# Patient Record
Sex: Female | Born: 1975 | Race: Black or African American | Hispanic: No | Smoking: Never smoker
Health system: Southern US, Community
[De-identification: ages and names within clinical notes are randomized; demographics above are authoritative.]

## PROBLEM LIST (undated history)

## (undated) DIAGNOSIS — I10 Essential (primary) hypertension: Secondary | ICD-10-CM

## (undated) HISTORY — PX: WISDOM TOOTH EXTRACTION: SHX21

---

## 1999-03-18 ENCOUNTER — Emergency Department (HOSPITAL_COMMUNITY): Admission: EM | Admit: 1999-03-18 | Discharge: 1999-03-18 | Payer: Self-pay | Admitting: *Deleted

## 1999-11-01 ENCOUNTER — Emergency Department (HOSPITAL_COMMUNITY): Admission: EM | Admit: 1999-11-01 | Discharge: 1999-11-01 | Payer: Self-pay | Admitting: *Deleted

## 1999-11-02 ENCOUNTER — Encounter: Payer: Self-pay | Admitting: Emergency Medicine

## 2002-11-07 ENCOUNTER — Inpatient Hospital Stay (HOSPITAL_COMMUNITY): Admission: AD | Admit: 2002-11-07 | Discharge: 2002-11-07 | Payer: Self-pay | Admitting: Family Medicine

## 2002-11-10 ENCOUNTER — Encounter: Payer: Self-pay | Admitting: Obstetrics and Gynecology

## 2002-11-10 ENCOUNTER — Inpatient Hospital Stay (HOSPITAL_COMMUNITY): Admission: AD | Admit: 2002-11-10 | Discharge: 2002-11-10 | Payer: Self-pay | Admitting: Obstetrics and Gynecology

## 2002-11-13 ENCOUNTER — Inpatient Hospital Stay (HOSPITAL_COMMUNITY): Admission: AD | Admit: 2002-11-13 | Discharge: 2002-11-13 | Payer: Self-pay | Admitting: *Deleted

## 2002-11-13 ENCOUNTER — Encounter: Payer: Self-pay | Admitting: Obstetrics and Gynecology

## 2002-11-16 ENCOUNTER — Inpatient Hospital Stay (HOSPITAL_COMMUNITY): Admission: AD | Admit: 2002-11-16 | Discharge: 2002-11-16 | Payer: Self-pay | Admitting: Obstetrics and Gynecology

## 2002-11-19 ENCOUNTER — Inpatient Hospital Stay (HOSPITAL_COMMUNITY): Admission: AD | Admit: 2002-11-19 | Discharge: 2002-11-19 | Payer: Self-pay | Admitting: Family Medicine

## 2002-11-26 ENCOUNTER — Inpatient Hospital Stay (HOSPITAL_COMMUNITY): Admission: AD | Admit: 2002-11-26 | Discharge: 2002-11-26 | Payer: Self-pay | Admitting: Family Medicine

## 2002-12-03 ENCOUNTER — Inpatient Hospital Stay (HOSPITAL_COMMUNITY): Admission: AD | Admit: 2002-12-03 | Discharge: 2002-12-03 | Payer: Self-pay | Admitting: *Deleted

## 2004-04-08 ENCOUNTER — Inpatient Hospital Stay (HOSPITAL_COMMUNITY): Admission: AD | Admit: 2004-04-08 | Discharge: 2004-04-09 | Payer: Self-pay | Admitting: Family Medicine

## 2004-05-09 ENCOUNTER — Other Ambulatory Visit: Admission: RE | Admit: 2004-05-09 | Discharge: 2004-05-09 | Payer: Self-pay | Admitting: Obstetrics and Gynecology

## 2004-09-11 ENCOUNTER — Ambulatory Visit (HOSPITAL_COMMUNITY): Admission: RE | Admit: 2004-09-11 | Discharge: 2004-09-11 | Payer: Self-pay | Admitting: Obstetrics and Gynecology

## 2004-10-15 ENCOUNTER — Inpatient Hospital Stay (HOSPITAL_COMMUNITY): Admission: AD | Admit: 2004-10-15 | Discharge: 2004-10-17 | Payer: Self-pay | Admitting: Obstetrics and Gynecology

## 2005-05-21 ENCOUNTER — Other Ambulatory Visit: Admission: RE | Admit: 2005-05-21 | Discharge: 2005-05-21 | Payer: Self-pay | Admitting: Obstetrics and Gynecology

## 2006-05-26 ENCOUNTER — Other Ambulatory Visit: Admission: RE | Admit: 2006-05-26 | Discharge: 2006-05-26 | Payer: Self-pay | Admitting: Obstetrics and Gynecology

## 2007-07-12 ENCOUNTER — Emergency Department (HOSPITAL_COMMUNITY): Admission: EM | Admit: 2007-07-12 | Discharge: 2007-07-12 | Payer: Self-pay | Admitting: Emergency Medicine

## 2010-11-07 ENCOUNTER — Inpatient Hospital Stay (INDEPENDENT_AMBULATORY_CARE_PROVIDER_SITE_OTHER)
Admission: RE | Admit: 2010-11-07 | Discharge: 2010-11-07 | Disposition: A | Discharge status: Home / Self Care | Payer: Medicaid Other | Source: Ambulatory Visit | Attending: Family Medicine | Admitting: Family Medicine

## 2010-11-07 DIAGNOSIS — H9209 Otalgia, unspecified ear: Secondary | ICD-10-CM

## 2010-11-07 DIAGNOSIS — K089 Disorder of teeth and supporting structures, unspecified: Secondary | ICD-10-CM

## 2011-02-01 NOTE — H&P (Signed)
Kara Cain, Kara Cain             ACCOUNT NO.:  [id number]   MEDICAL RECORD NO.:  [id number]          PATIENT TYPE:  INP   LOCATION:  9176                          FACILITY:  [facility]   PHYSICIAN:  Hal Morales, M.D.DATE OF BIRTH:  1976-07-13   DATE OF ADMISSION:  10/15/2004  DATE OF DISCHARGE:                                HISTORY & PHYSICAL   This is a 35 year old gravida 4, para 0-0-3-0 at 38-5/7 weeks who presents  from the office in labor.  She denies leaking or bleeding and reports  positive fetal movement.  Her pregnancy has been followed by the nurse  midwife service and remarkable for:  1.  Late to care.  2.  Rubella nonimmune.  3.  History of sexually-transmitted diseases.  4.  Elective abortion x2 and ectopic x1.  5.  Group B strep negative.   OBSTETRICAL HISTORY:  Remarkable for two elective abortions in 2003.  She  had an ectopic pregnancy in 2004 treated with methotrexate.   MEDICAL HISTORY:  Remarkable for Gonorrhea and Trichomonas in 2000 and 1999,  respectively which were treated.  Childhood Varicella.  Urinary tract  infection in 1999.   FAMILY HISTORY:  Remarkable for a mother and aunts with hypertension,  grandmother with diabetes, cousin with unknown type of cancer.  Sisters who  smoke.   SURGICAL HISTORY:  Remarkable for elective abortion x2 in 2003.   GENETIC HISTORY:  Remarkable for a sister with a hole in her heart as a baby  which closed on its own.   SOCIAL HISTORY:  The patient is single.  The father of the baby, Fara Olden  is involved and supportive.  She is of the [place] faith.  She denies any  alcohol, tobacco or drug use.   HISTORY OF CURRENT PREGNANCY:  The patient entered care at [redacted] weeks  gestation.  She had an ultrasound at 18 weeks which was normal.  She had  requested to have her tubes tied and did sign tubal papers at 29 weeks.  She  had another ultrasound for low weight gain at 33 weeks which showed 50-75th  percentile, a  grade I placenta and normal AFI and her group B strep, and GC  and Chlamydia were both all negative.   PRENATAL LABORATORIES:  Hemoglobin 12.1, platelets 266,000, blood type B+,  antibody screen negative, Sickle Cell negative.  RPR nonreactive.  Rubella  non immune.  Hepatitis negative.  HIV negative.  Pap test negative.  Gonorrhea negative.  Chlamydia negative.  Cystic fibrosis negative.  Quad  screen within normal limits.  Group B strep negative.   OBJECTIVE DATA:  VITAL SIGNS:  Vital signs stable.  Afebrile.  HEENT:  Within normal limits.  NECK:  Thyroid normal, not enlarged.  CHEST: Clear to auscultation.  HEART:  A regular rate and rhythm.  ABDOMEN:  Gravid at 38 cm.  Vertex de Leopold's.  The EFM shows a reactive  fetal heart rate with contractions every three minutes.  Cervix was  initially 4 cm dilated and is now 5 cm dilated, completely effaced and 0  station with a vertex  presentation.  EXTREMITIES:  Within normal limits.   ASSESSMENT:  1.  Intra-uterine pregnancy at 38-5/7 weeks.  2.  Active labor.   PLAN:  1.  Admit to birthing suites.  2.  Routine certified nurse midwife orders.  3.  Stadol for analgesia per request.      MLW/MEDQ  D:  10/15/2004  T:  10/15/2004  Job:  [id number]

## 2013-07-18 DIAGNOSIS — Z0000 Encounter for general adult medical examination without abnormal findings: Secondary | ICD-10-CM | POA: Insufficient documentation

## 2016-11-30 ENCOUNTER — Emergency Department (HOSPITAL_COMMUNITY): Payer: 59

## 2016-11-30 ENCOUNTER — Emergency Department (HOSPITAL_COMMUNITY)
Admission: EM | Admit: 2016-11-30 | Discharge: 2016-11-30 | Disposition: A | Discharge status: Home / Self Care | Payer: 59 | Attending: Emergency Medicine | Admitting: Emergency Medicine

## 2016-11-30 ENCOUNTER — Encounter (HOSPITAL_COMMUNITY): Payer: Self-pay | Admitting: Emergency Medicine

## 2016-11-30 DIAGNOSIS — S299XXA Unspecified injury of thorax, initial encounter: Secondary | ICD-10-CM | POA: Diagnosis not present

## 2016-11-30 DIAGNOSIS — T148XXA Other injury of unspecified body region, initial encounter: Secondary | ICD-10-CM | POA: Diagnosis not present

## 2016-11-30 DIAGNOSIS — Y939 Activity, unspecified: Secondary | ICD-10-CM | POA: Insufficient documentation

## 2016-11-30 DIAGNOSIS — M542 Cervicalgia: Secondary | ICD-10-CM | POA: Diagnosis not present

## 2016-11-30 DIAGNOSIS — R51 Headache: Secondary | ICD-10-CM | POA: Diagnosis not present

## 2016-11-30 DIAGNOSIS — M549 Dorsalgia, unspecified: Secondary | ICD-10-CM | POA: Diagnosis not present

## 2016-11-30 DIAGNOSIS — M545 Low back pain: Secondary | ICD-10-CM | POA: Diagnosis not present

## 2016-11-30 DIAGNOSIS — M79601 Pain in right arm: Secondary | ICD-10-CM | POA: Diagnosis not present

## 2016-11-30 DIAGNOSIS — M25561 Pain in right knee: Secondary | ICD-10-CM | POA: Diagnosis not present

## 2016-11-30 DIAGNOSIS — Y999 Unspecified external cause status: Secondary | ICD-10-CM | POA: Insufficient documentation

## 2016-11-30 DIAGNOSIS — S5011XA Contusion of right forearm, initial encounter: Secondary | ICD-10-CM | POA: Diagnosis not present

## 2016-11-30 DIAGNOSIS — M79604 Pain in right leg: Secondary | ICD-10-CM | POA: Diagnosis not present

## 2016-11-30 DIAGNOSIS — S6991XA Unspecified injury of right wrist, hand and finger(s), initial encounter: Secondary | ICD-10-CM | POA: Diagnosis not present

## 2016-11-30 DIAGNOSIS — S59911A Unspecified injury of right forearm, initial encounter: Secondary | ICD-10-CM | POA: Diagnosis not present

## 2016-11-30 DIAGNOSIS — M25531 Pain in right wrist: Secondary | ICD-10-CM | POA: Diagnosis not present

## 2016-11-30 DIAGNOSIS — Y92410 Unspecified street and highway as the place of occurrence of the external cause: Secondary | ICD-10-CM | POA: Diagnosis not present

## 2016-11-30 DIAGNOSIS — S199XXA Unspecified injury of neck, initial encounter: Secondary | ICD-10-CM | POA: Diagnosis not present

## 2016-11-30 DIAGNOSIS — S63501A Unspecified sprain of right wrist, initial encounter: Secondary | ICD-10-CM | POA: Diagnosis not present

## 2016-11-30 DIAGNOSIS — M79631 Pain in right forearm: Secondary | ICD-10-CM | POA: Diagnosis not present

## 2016-11-30 MED ORDER — OXYCODONE-ACETAMINOPHEN 5-325 MG PO TABS
1.0000 | ORAL_TABLET | Freq: Once | ORAL | Status: AC
  Administered 2016-11-30: 1 via ORAL
  Filled 2016-11-30: qty 1

## 2016-11-30 MED ORDER — CYCLOBENZAPRINE HCL 10 MG PO TABS: 10.0000 mg | ORAL_TABLET | Freq: Two times a day (BID) | ORAL | 0 refills | Status: DC | PRN

## 2016-11-30 MED ORDER — IBUPROFEN 800 MG PO TABS: 800.0000 mg | ORAL_TABLET | Freq: Three times a day (TID) | ORAL | 0 refills | Status: DC
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
## 2016-12-04 DIAGNOSIS — S8001XA Contusion of right knee, initial encounter: Secondary | ICD-10-CM | POA: Diagnosis not present

## 2016-12-18 DIAGNOSIS — S8001XD Contusion of right knee, subsequent encounter: Secondary | ICD-10-CM | POA: Diagnosis not present

## 2017-01-15 DIAGNOSIS — S8001XD Contusion of right knee, subsequent encounter: Secondary | ICD-10-CM | POA: Diagnosis not present

## 2017-02-11 ENCOUNTER — Emergency Department (HOSPITAL_COMMUNITY): Payer: 59

## 2017-02-11 ENCOUNTER — Emergency Department (HOSPITAL_COMMUNITY)
Admission: EM | Admit: 2017-02-11 | Discharge: 2017-02-11 | Disposition: A | Discharge status: Home / Self Care | Payer: 59 | Attending: Emergency Medicine | Admitting: Emergency Medicine

## 2017-02-11 ENCOUNTER — Encounter (HOSPITAL_COMMUNITY): Payer: Self-pay | Admitting: Emergency Medicine

## 2017-02-11 DIAGNOSIS — Z79899 Other long term (current) drug therapy: Secondary | ICD-10-CM | POA: Diagnosis not present

## 2017-02-11 DIAGNOSIS — M542 Cervicalgia: Secondary | ICD-10-CM | POA: Diagnosis not present

## 2017-02-11 DIAGNOSIS — M436 Torticollis: Secondary | ICD-10-CM | POA: Diagnosis not present

## 2017-02-11 DIAGNOSIS — S199XXA Unspecified injury of neck, initial encounter: Secondary | ICD-10-CM | POA: Diagnosis not present

## 2017-02-11 MED ORDER — HYDROCODONE-ACETAMINOPHEN 5-325 MG PO TABS
2.0000 | ORAL_TABLET | Freq: Once | ORAL | Status: AC
Start: 2017-02-11 — End: 2017-02-11
  Administered 2017-02-11: 2 via ORAL
  Filled 2017-02-11: qty 2

## 2017-02-11 MED ORDER — DIAZEPAM 5 MG PO TABS
5.0000 mg | ORAL_TABLET | Freq: Once | ORAL | Status: AC
  Administered 2017-02-11: 5 mg via ORAL
  Filled 2017-02-11: qty 1

## 2017-02-11 MED ORDER — HYDROCODONE-ACETAMINOPHEN 5-325 MG PO TABS: 2.0000 | ORAL_TABLET | ORAL | 0 refills | Status: DC | PRN

## 2017-02-11 MED ORDER — METHOCARBAMOL 500 MG PO TABS: 500.0000 mg | ORAL_TABLET | Freq: Two times a day (BID) | ORAL | 0 refills | Status: DC

## 2017-02-11 NOTE — ED Notes (Signed)
Bed: [facility]01 Expected date:  Expected time:  Means of arrival:  Comments: Triage 1 

## 2017-02-11 NOTE — Discharge Instructions (Signed)
See your Physician for recheck.   You ned to seek on going evaluation and treatment of blood pressure problems.

## 2017-02-11 NOTE — ED Triage Notes (Signed)
Pt reports she began to have R neck pain that radiates to head since this am. No recent injuries. No hx of migraines.
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
## 2017-04-16 DIAGNOSIS — M62830 Muscle spasm of back: Secondary | ICD-10-CM | POA: Insufficient documentation

## 2017-08-02 ENCOUNTER — Ambulatory Visit (HOSPITAL_COMMUNITY)
Admission: EM | Admit: 2017-08-02 | Discharge: 2017-08-02 | Disposition: A | Discharge status: Home / Self Care | Payer: 59 | Attending: Family Medicine | Admitting: Family Medicine

## 2017-08-02 ENCOUNTER — Encounter (HOSPITAL_COMMUNITY): Payer: Self-pay | Admitting: Family Medicine

## 2017-08-02 DIAGNOSIS — R1310 Dysphagia, unspecified: Secondary | ICD-10-CM

## 2017-08-02 MED ORDER — GI COCKTAIL ~~LOC~~
30.0000 mL | Freq: Once | ORAL | Status: AC
  Administered 2017-08-02: 30 mL via ORAL

## 2017-08-02 MED ORDER — RANITIDINE HCL 150 MG/10ML PO SYRP: 150.0000 mg | ORAL_SOLUTION | Freq: Two times a day (BID) | ORAL | 0 refills | Status: DC

## 2017-08-02 MED ORDER — GI COCKTAIL ~~LOC~~
ORAL | Status: AC
  Filled 2017-08-02: qty 30

## 2017-08-02 NOTE — ED Triage Notes (Signed)
Pt here for irritated sore throat x 2 weeks. Reports that 2 weeks ago she had a cold, took some mucinex and vomited it back up. This is when everything started.

## 2017-08-02 NOTE — Discharge Instructions (Addendum)
I have prescribed a liquid reflux medication for you. Take 10 mL twice a day. Allow 1 week for symptoms to improve.  If symptoms persist please call [facility] on to set up an appointment for further imaging of the throat.  Continue to try and stay hydrated and nourished as best you can.

## 2017-08-02 NOTE — ED Provider Notes (Signed)
[facility]    CSN: [id number] Arrival date & time: 08/02/17  1636     History   Chief Complaint Chief Complaint  Patient presents with  . Sore Throat    HPI Kara Cain is a 41 y.o. female coming in for evaluation of "feeling like something is stuck in her throat". She states that she had cold 2 weeks ago and took Mucinex. After taking Mucinex she vomitted and since this time she has felt a constant irritation in her throat. She does not describe it has pain, but more so feeling like things will get stuck in there along with a "dryness". She has only been taking in fluids and soft foods like mashed potatoes because of this. She still states that she has been throwing food up. She believes food is passing as she is throwing up clear liquid versus undigested food. This sensation has been constant for the past 2 weeks. Denies relation to food/meal time and cannot associate her symptoms to any aggravating or relieving factors. She continues to drink herbal tea in order to coat her throat.   Of note, this has caused the patient significant anxiety and is emotional about her situation.  HPI  History reviewed. No pertinent past medical history.  There are no active problems to display for this patient.   Past Surgical History:  Procedure Laterality Date  . WISDOM TOOTH EXTRACTION      OB History    No data available       Home Medications    Prior to Admission medications   Medication Sig Start Date End Date Taking? Authorizing Provider  HYDROcodone-acetaminophen (NORCO/VICODIN) 5-325 MG tablet Take 2 tablets by mouth every 4 (four) hours as needed. 02/11/17   Elson AreasSofia, Leslie K, PA-C  methocarbamol (ROBAXIN) 500 MG tablet Take 1 tablet (500 mg total) by mouth 2 (two) times daily. 02/11/17   Elson AreasSofia, Leslie K, PA-C  ranitidine (ZANTAC) 150 MG/10ML syrup Take 10 mLs (150 mg total) 2 (two) times daily by mouth. 08/02/17 09/01/17  Jennea Rager, Junius CreamerHallie C, PA-C    Family  History History reviewed. No pertinent family history.  Denies family history of thyroid disease.   Social History Social History   Tobacco Use  . Smoking status: Never Smoker  . Smokeless tobacco: Never Used  Substance Use Topics  . Alcohol use: Not on file  . Drug use: Not on file   No smoking history.    Allergies   Patient has no known allergies.   Review of Systems Review of Systems  Constitutional: Negative for fever.  HENT: Positive for trouble swallowing. Negative for congestion and sore throat.        Denies neck pain or stiffness  Respiratory: Positive for choking. Negative for cough and shortness of breath.   Cardiovascular: Negative for chest pain.  Gastrointestinal: Positive for nausea and vomiting. Negative for diarrhea.  Musculoskeletal: Negative for neck pain and neck stiffness.     Physical Exam Triage Vital Signs ED Triage Vitals [08/02/17 1649]  Enc Vitals Group     BP (!) 168/98     Pulse Rate 90     Resp 18     Temp 98.2 F (36.8 C)     Temp src      SpO2 100 %     Weight      Height      Head Circumference      Peak Flow      Pain Score  Pain Loc      Pain Edu?      Excl. in GC?    No data found.  Updated Vital Signs BP (!) 168/98   Pulse 90   Temp 98.2 F (36.8 C)   Resp 18   SpO2 100%    Physical Exam  Constitutional: She appears well-developed and well-nourished.  HENT:  Head: Normocephalic and atraumatic.  Mouth/Throat: Uvula is midline, oropharynx is clear and moist and mucous membranes are normal. No oral lesions. No posterior oropharyngeal erythema.  Neck: Normal range of motion. Neck supple. No thyromegaly present.  Lymphadenopathy:    She has no cervical adenopathy.     UC Treatments / Results  Labs (all labs ordered are listed, but only abnormal results are displayed) Labs Reviewed - No data to display  EKG  EKG Interpretation None       Radiology No results found.  Procedures Procedures  (including critical care time)  Medications Ordered in UC Medications  gi cocktail (Maalox,Lidocaine,Donnatal) (30 mLs Oral Given 08/02/17 1714)     Initial Impression / Assessment and Plan / UC Course  I have reviewed the triage vital signs and the nursing notes.  Pertinent labs & imaging results that were available during my care of the patient were reviewed by me and considered in my medical decision making (see chart for details).    Patient experienced modest relief of sensation and felt like the Gi cocktail helped. She seemed significantly less emotional at the end of the visit. Ranitidine was prescribed per her request for a liquid GERD medicine. She was advised to try this twice a day for 1 week. If her symptoms persist I recommended her to call [facility] for further evaluation of her throat.   Final Clinical Impressions(s) / UC Diagnoses   Final diagnoses:  Dysphagia, unspecified type    ED Discharge Orders        Ordered    ranitidine (ZANTAC) 150 MG/10ML syrup  2 times daily     08/02/17 1732       Controlled Substance Prescriptions [place] Controlled Substance Registry consulted? Not Applicable   Lew DawesWieters, Aune Adami C, [place] 08/02/17 1753

## 2017-08-05 ENCOUNTER — Other Ambulatory Visit: Payer: Self-pay | Admitting: Physician Assistant

## 2017-08-05 DIAGNOSIS — R0989 Other specified symptoms and signs involving the circulatory and respiratory systems: Secondary | ICD-10-CM

## 2017-08-05 DIAGNOSIS — R1110 Vomiting, unspecified: Secondary | ICD-10-CM | POA: Diagnosis not present

## 2017-08-05 DIAGNOSIS — R1313 Dysphagia, pharyngeal phase: Secondary | ICD-10-CM

## 2017-08-05 DIAGNOSIS — F458 Other somatoform disorders: Secondary | ICD-10-CM | POA: Diagnosis not present

## 2017-08-05 DIAGNOSIS — F419 Anxiety disorder, unspecified: Secondary | ICD-10-CM | POA: Diagnosis not present

## 2017-08-06 ENCOUNTER — Encounter: Payer: Self-pay | Admitting: Radiology

## 2017-08-06 ENCOUNTER — Ambulatory Visit
Admission: RE | Admit: 2017-08-06 | Discharge: 2017-08-06 | Disposition: A | Discharge status: Home / Self Care | Payer: 59 | Source: Ambulatory Visit | Attending: Physician Assistant | Admitting: Physician Assistant

## 2017-08-06 DIAGNOSIS — R1110 Vomiting, unspecified: Secondary | ICD-10-CM

## 2017-08-06 DIAGNOSIS — R1313 Dysphagia, pharyngeal phase: Secondary | ICD-10-CM

## 2017-08-06 DIAGNOSIS — R0989 Other specified symptoms and signs involving the circulatory and respiratory systems: Secondary | ICD-10-CM

## 2017-08-06 DIAGNOSIS — R1310 Dysphagia, unspecified: Secondary | ICD-10-CM | POA: Diagnosis not present

## 2017-08-20 DIAGNOSIS — R1313 Dysphagia, pharyngeal phase: Secondary | ICD-10-CM | POA: Diagnosis not present

## 2017-08-20 DIAGNOSIS — F419 Anxiety disorder, unspecified: Secondary | ICD-10-CM | POA: Diagnosis not present

## 2017-08-20 DIAGNOSIS — Z1211 Encounter for screening for malignant neoplasm of colon: Secondary | ICD-10-CM | POA: Diagnosis not present

## 2017-08-20 DIAGNOSIS — F458 Other somatoform disorders: Secondary | ICD-10-CM | POA: Diagnosis not present

## 2017-10-24 ENCOUNTER — Other Ambulatory Visit: Payer: Self-pay

## 2017-10-24 ENCOUNTER — Encounter (HOSPITAL_COMMUNITY): Payer: Self-pay | Admitting: Emergency Medicine

## 2017-10-24 ENCOUNTER — Ambulatory Visit (HOSPITAL_COMMUNITY)
Admission: EM | Admit: 2017-10-24 | Discharge: 2017-10-24 | Disposition: A | Discharge status: Home / Self Care | Payer: 59 | Attending: Internal Medicine | Admitting: Internal Medicine

## 2017-10-24 DIAGNOSIS — B349 Viral infection, unspecified: Secondary | ICD-10-CM

## 2017-10-24 DIAGNOSIS — R0981 Nasal congestion: Secondary | ICD-10-CM | POA: Diagnosis not present

## 2017-10-24 DIAGNOSIS — R51 Headache: Secondary | ICD-10-CM | POA: Diagnosis not present

## 2017-10-24 DIAGNOSIS — R05 Cough: Secondary | ICD-10-CM | POA: Diagnosis not present

## 2017-10-24 DIAGNOSIS — J029 Acute pharyngitis, unspecified: Secondary | ICD-10-CM | POA: Diagnosis not present

## 2017-10-24 MED ORDER — MELOXICAM 7.5 MG PO TABS: 7.5000 mg | ORAL_TABLET | Freq: Every day | ORAL | 0 refills | Status: DC

## 2017-10-24 MED ORDER — BENZONATATE 100 MG PO CAPS: 100.0000 mg | ORAL_CAPSULE | Freq: Three times a day (TID) | ORAL | 0 refills | Status: DC

## 2017-10-24 MED ORDER — IPRATROPIUM BROMIDE 0.06 % NA SOLN: 2.0000 | Freq: Four times a day (QID) | NASAL | 0 refills | Status: DC

## 2017-10-24 MED ORDER — PREDNISONE 20 MG PO TABS: 40.0000 mg | ORAL_TABLET | Freq: Every day | ORAL | 0 refills | Status: AC

## 2017-10-24 MED ORDER — FLUTICASONE PROPIONATE 50 MCG/ACT NA SUSP: 2.0000 | Freq: Every day | NASAL | 0 refills | Status: DC

## 2017-10-24 NOTE — ED Triage Notes (Signed)
The patient presented to the [facility] with a complaint of a cough and nasal congestion x 3 days. 

## 2017-10-24 NOTE — Discharge Instructions (Signed)
Tessalon for cough. Start flonase, atrovent nasal spray for nasal congestion/drainage. You can use over the counter nasal saline rinse such as neti pot for nasal congestion. Keep hydrated, your urine should be clear to pale yellow in color. Tylenol/motrin for fever and pain. Monitor for any worsening of symptoms, chest pain, shortness of breath, wheezing, swelling of the throat, follow up for reevaluation.   If cannot tolerate flonase, can take prednisone for sinus pressure. Can get over the counter allergy medicine/decongestant such as zyrtec-D for nasal congestion/drainage.

## 2017-10-24 NOTE — ED Provider Notes (Signed)
[facility]    CSN: [id number] Arrival date & time: 10/24/17  1436     History   Chief Complaint Chief Complaint  Patient presents with  . Cough    HPI Kara Cain is a 42 y.o. female.   42 year old female comes in for 3-day history of URI symptoms.  She has had productive cough, nasal congestion, rhinorrhea, headache. Headache is frontal, with pressure. States sore throat that resolved. Denies fever, chills, night sweats. otc cold medicine without relief. Positive sick contact. Never smoker.       History reviewed. No pertinent past medical history.  There are no active problems to display for this patient.   Past Surgical History:  Procedure Laterality Date  . WISDOM TOOTH EXTRACTION      OB History    No data available       Home Medications    Prior to Admission medications   Medication Sig Start Date End Date Taking? Authorizing Provider  benzonatate (TESSALON) 100 MG capsule Take 1 capsule (100 mg total) by mouth every 8 (eight) hours. 10/24/17   Cathie HoopsYu, Emika Tiano V, PA-C  fluticasone (FLONASE) 50 MCG/ACT nasal spray Place 2 sprays into both nostrils daily. 10/24/17   Cathie HoopsYu, Melquiades Kovar V, PA-C  ipratropium (ATROVENT) 0.06 % nasal spray Place 2 sprays into both nostrils 4 (four) times daily. 10/24/17   Cathie HoopsYu, Burr Soffer V, PA-C  meloxicam (MOBIC) 7.5 MG tablet Take 1 tablet (7.5 mg total) by mouth daily. 10/24/17   Cathie HoopsYu, Verley Pariseau V, PA-C  predniSONE (DELTASONE) 20 MG tablet Take 2 tablets (40 mg total) by mouth daily for 4 days. 10/24/17 10/28/17  Belinda FisherYu, Lonya Johannesen V, PA-C    Family History History reviewed. No pertinent family history.  Social History Social History   Tobacco Use  . Smoking status: Never Smoker  . Smokeless tobacco: Never Used  Substance Use Topics  . Alcohol use: Not on file  . Drug use: Not on file     Allergies   Patient has no known allergies.   Review of Systems Review of Systems  Reason unable to perform ROS: See HPI as above.     Physical Exam Triage  Vital Signs ED Triage Vitals  Enc Vitals Group     BP 10/24/17 1607 (!) 161/107     Pulse Rate 10/24/17 1607 96     Resp 10/24/17 1607 18     Temp 10/24/17 1607 98.7 F (37.1 C)     Temp Source 10/24/17 1607 Oral     SpO2 10/24/17 1607 100 %     Weight --      Height --      Head Circumference --      Peak Flow --      Pain Score 10/24/17 1605 8     Pain Loc --      Pain Edu? --      Excl. in GC? --    No data found.  Updated Vital Signs BP (!) 161/107 (BP Location: Left Arm)   Pulse 96   Temp 98.7 F (37.1 C) (Oral)   Resp 18   LMP 10/17/2017 (Exact Date)   SpO2 100%    Physical Exam  Constitutional: She is oriented to person, place, and time. She appears well-developed and well-nourished. No distress.  HENT:  Head: Normocephalic and atraumatic.  Right Ear: External ear and ear canal normal. Tympanic membrane is erythematous. Tympanic membrane is not bulging.  Left Ear: External ear and ear canal normal.  Tympanic membrane is erythematous. Tympanic membrane is not bulging.  Nose: Mucosal edema and rhinorrhea present. Right sinus exhibits maxillary sinus tenderness and frontal sinus tenderness. Left sinus exhibits maxillary sinus tenderness and frontal sinus tenderness.  Mouth/Throat: Uvula is midline, oropharynx is clear and moist and mucous membranes are normal.  Eyes: Conjunctivae are normal. Pupils are equal, round, and reactive to light.  Neck: Normal range of motion. Neck supple.  Cardiovascular: Normal rate, regular rhythm and normal heart sounds. Exam reveals no gallop and no friction rub.  No murmur heard. Pulmonary/Chest: Effort normal and breath sounds normal. She has no decreased breath sounds. She has no wheezes. She has no rhonchi. She has no rales.  Lymphadenopathy:    She has no cervical adenopathy.  Neurological: She is alert and oriented to person, place, and time.  Skin: Skin is warm and dry.  Psychiatric: She has a normal mood and affect. Her  behavior is normal. Judgment normal.    UC Treatments / Results  Labs (all labs ordered are listed, but only abnormal results are displayed) Labs Reviewed - No data to display  EKG  EKG Interpretation None       Radiology No results found.  Procedures Procedures (including critical care time)  Medications Ordered in UC Medications - No data to display   Initial Impression / Assessment and Plan / UC Course  I have reviewed the triage vital signs and the nursing notes.  Pertinent labs & imaging results that were available during my care of the patient were reviewed by me and considered in my medical decision making (see chart for details).    Discussed with patient history and exam most consistent with viral URI. Symptomatic treatment as needed. Push fluids. Return precautions given.   Final Clinical Impressions(s) / UC Diagnoses   Final diagnoses:  Viral illness    ED Discharge Orders        Ordered    benzonatate (TESSALON) 100 MG capsule  Every 8 hours     10/24/17 1655    meloxicam (MOBIC) 7.5 MG tablet  Daily     10/24/17 1655    fluticasone (FLONASE) 50 MCG/ACT nasal spray  Daily     10/24/17 1655    ipratropium (ATROVENT) 0.06 % nasal spray  4 times daily     10/24/17 1655    predniSONE (DELTASONE) 20 MG tablet  Daily     10/24/17 1655        Belinda Fisher, PA-C 10/24/17 1702

## 2018-02-07 ENCOUNTER — Encounter: Payer: Self-pay | Admitting: Adult Health

## 2018-02-07 ENCOUNTER — Ambulatory Visit: Payer: Self-pay | Admitting: Adult Health

## 2018-02-07 VITALS — BP 120/88 | HR 88 | Temp 98.7°F | Ht 64.0 in | Wt 166.2 lb

## 2018-02-07 DIAGNOSIS — Z0289 Encounter for other administrative examinations: Secondary | ICD-10-CM

## 2018-02-07 DIAGNOSIS — M62830 Muscle spasm of back: Secondary | ICD-10-CM
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
## 2018-02-07 NOTE — Patient Instructions (Signed)

## 2018-02-07 NOTE — Addendum Note (Signed)
Addended by: Berniece Pap on: 02/07/2018 02:03 PM   Modules accepted: Level of Service

## 2018-10-27 ENCOUNTER — Other Ambulatory Visit: Payer: Self-pay | Admitting: Family

## 2018-10-27 DIAGNOSIS — Z30013 Encounter for initial prescription of injectable contraceptive: Secondary | ICD-10-CM | POA: Diagnosis not present

## 2018-10-27 DIAGNOSIS — Z01419 Encounter for gynecological examination (general) (routine) without abnormal findings: Secondary | ICD-10-CM | POA: Diagnosis not present

## 2018-10-27 DIAGNOSIS — Z3200 Encounter for pregnancy test, result unknown: Secondary | ICD-10-CM | POA: Diagnosis not present

## 2018-10-27 DIAGNOSIS — Z1231 Encounter for screening mammogram for malignant neoplasm of breast: Secondary | ICD-10-CM

## 2018-10-28 ENCOUNTER — Encounter (HOSPITAL_COMMUNITY): Payer: Self-pay | Admitting: Emergency Medicine

## 2018-10-28 ENCOUNTER — Other Ambulatory Visit: Payer: Self-pay

## 2018-10-28 ENCOUNTER — Ambulatory Visit (HOSPITAL_COMMUNITY)
Admission: EM | Admit: 2018-10-28 | Discharge: 2018-10-28 | Disposition: A | Discharge status: Home / Self Care | Payer: 59 | Attending: Emergency Medicine | Admitting: Emergency Medicine

## 2018-10-28 DIAGNOSIS — R590 Localized enlarged lymph nodes: Secondary | ICD-10-CM | POA: Diagnosis not present

## 2018-10-28 LAB — POCT RAPID STREP A: STREPTOCOCCUS, GROUP A SCREEN (DIRECT): NEGATIVE

## 2018-10-28 MED ORDER — IBUPROFEN 600 MG PO TABS: 600.0000 mg | ORAL_TABLET | Freq: Four times a day (QID) | ORAL | 0 refills | Status: DC | PRN

## 2018-10-28 NOTE — ED Triage Notes (Signed)
Onset Sunday of right side of neck.  Slight pain with swallowing, but significant with palpation of right neck and able to feel firmness to right neck.  No cough, no cold, no runny nose.

## 2018-10-28 NOTE — Discharge Instructions (Signed)
Use anti-inflammatories for pain/swelling. You may take up to 800 mg Ibuprofen every 8 hours with food. You may supplement Ibuprofen with Tylenol [phone] mg every 8 hours.   Warm compresses   Please follow up with primary care if swelling not resolving on its own in 2-3 weeks Please follow up here or in ED if developing increased swelling, redness, pain, difficulty moving neck, swallowing, fever

## 2018-10-28 NOTE — ED Provider Notes (Signed)
[facility]    CSN: [id number] Arrival date & time: 10/28/18  3532     History   Chief Complaint Chief Complaint  Patient presents with  . Lymphadenopathy    HPI Kara Cain is a 43 y.o. female no contributing past medical history presenting today for evaluation of lymph node swelling.  Patient states that over the past 2 days she has noticed tenderness and pain in her right neck.  She has noticed a lymph node being swollen.  She has had minimal sore throat.  Denies associated URI symptoms of cough, congestion or ear pain.  She has tried taking an over-the-counter lozenge without relief.  Denies any fevers.  Denies difficulty moving neck.  Denies night sweats or unintentional weight loss.  Eating and drinking like normal.  HPI  History reviewed. No pertinent past medical history.  Patient Active Problem List   Diagnosis Date Noted  . Spasm of back muscles 04/16/2017  . Routine health maintenance 07/18/2013    Past Surgical History:  Procedure Laterality Date  . WISDOM TOOTH EXTRACTION      OB History   No obstetric history on file.      Home Medications    Prior to Admission medications   Medication Sig Start Date End Date Taking? Authorizing Provider  ibuprofen (ADVIL,MOTRIN) 600 MG tablet Take 1 tablet (600 mg total) by mouth every 6 (six) hours as needed. 10/28/18   , Junius Creamer, PA-C    Family History Family History  Problem Relation Age of Onset  . Hypertension Mother     Social History Social History   Tobacco Use  . Smoking status: Never Smoker  . Smokeless tobacco: Never Used  Substance Use Topics  . Alcohol use: Yes  . Drug use: Never     Allergies   Hydrocodone   Review of Systems Review of Systems  Constitutional: Negative for activity change, appetite change, chills, fatigue and fever.  HENT: Negative for congestion, ear pain, rhinorrhea, sinus pressure, sore throat and trouble swallowing.   Eyes: Negative for  discharge and redness.  Respiratory: Negative for cough, chest tightness and shortness of breath.   Cardiovascular: Negative for chest pain.  Gastrointestinal: Negative for abdominal pain, diarrhea, nausea and vomiting.  Musculoskeletal: Positive for neck pain. Negative for myalgias and neck stiffness.  Skin: Negative for rash.  Neurological: Negative for dizziness, light-headedness and headaches.  Hematological: Positive for adenopathy.     Physical Exam Triage Vital Signs ED Triage Vitals  Enc Vitals Group     BP 10/28/18 0836 (!) 135/103     Pulse Rate 10/28/18 0836 88     Resp 10/28/18 0836 16     Temp 10/28/18 0836 98.9 F (37.2 C)     Temp Source 10/28/18 0836 Oral     SpO2 10/28/18 0836 100 %     Weight --      Height --      Head Circumference --      Peak Flow --      Pain Score 10/28/18 0846 4     Pain Loc --      Pain Edu? --      Excl. in GC? --    No data found.  Updated Vital Signs BP (!) 135/103 (BP Location: Left Arm)   Pulse 88   Temp 98.9 F (37.2 C) (Oral)   Resp 16   SpO2 100%   Visual Acuity Right Eye Distance:   Left Eye Distance:  Bilateral Distance:    Right Eye Near:   Left Eye Near:    Bilateral Near:     Physical Exam Vitals signs and nursing note reviewed.  Constitutional:      General: She is not in acute distress.    Appearance: She is well-developed.  HENT:     Head: Normocephalic and atraumatic.     Ears:     Comments: Bilateral ears without tenderness to palpation of external auricle, tragus and mastoid, EAC's without erythema or swelling, TM's with good bony landmarks and cone of light. Non erythematous.    Nose:     Comments: Nasal mucosa pink, no turbinate swelling    Mouth/Throat:     Comments: Oral mucosa pink and moist, no tonsillar enlargement or exudate. Posterior pharynx patent and nonerythematous, no uvula deviation or swelling. Normal phonation. Eyes:     Conjunctiva/sclera: Conjunctivae normal.  Neck:      Musculoskeletal: Neck supple.     Comments: Palpable tender lymphadenopathy to superior anterior cervical chain near tonsillar area, no other overlying erythema, induration  Full active range of motion of neck Cardiovascular:     Rate and Rhythm: Normal rate and regular rhythm.     Heart sounds: No murmur.  Pulmonary:     Effort: Pulmonary effort is normal. No respiratory distress.     Breath sounds: Normal breath sounds.  Abdominal:     Palpations: Abdomen is soft.     Tenderness: There is no abdominal tenderness.  Lymphadenopathy:     Cervical: Cervical adenopathy present.  Skin:    General: Skin is warm and dry.  Neurological:     Mental Status: She is alert.      UC Treatments / Results  Labs (all labs ordered are listed, but only abnormal results are displayed) Labs Reviewed  CULTURE, GROUP A STREP [facility]  POCT RAPID STREP A    EKG None  Radiology No results found.  Procedures Procedures (including critical care time)  Medications Ordered in UC Medications - No data to display  Initial Impression / Assessment and Plan / UC Course  I have reviewed the triage vital signs and the nursing notes.  Pertinent labs & imaging results that were available during my care of the patient were reviewed by me and considered in my medical decision making (see chart for details).     Patient with lymphadenopathy, minimal associated symptoms, this time will treat symptomatically with Tylenol and ibuprofen, warm compresses, continue to monitor for gradual self resolution in 2 to 3 weeks.  Follow-up with PCP if swelling persisting.  Do not suspect underlying deep space infection at this time, but will continue to monitor.  Advised to return if she is developing increased pain, swelling, redness or difficulty moving neck, developing fever.Discussed strict return precautions. Patient verbalized understanding and is agreeable with plan.  Final Clinical Impressions(s) / UC Diagnoses     Final diagnoses:  Lymphadenopathy of right cervical region     Discharge Instructions     Use anti-inflammatories for pain/swelling. You may take up to 800 mg Ibuprofen every 8 hours with food. You may supplement Ibuprofen with Tylenol [phone] mg every 8 hours.   Warm compresses   Please follow up with primary care if swelling not resolving on its own in 2-3 weeks Please follow up here or in ED if developing increased swelling, redness, pain, difficulty moving neck, swallowing, fever   ED Prescriptions    Medication Sig Dispense Auth. Provider   ibuprofen (  ADVIL,MOTRIN) 600 MG tablet Take 1 tablet (600 mg total) by mouth every 6 (six) hours as needed. 30 tablet , [place] C, PA-C     Controlled Substance Prescriptions  Controlled Substance Registry consulted? Not Applicable   Lew Dawes, [place] 10/28/18 1002

## 2018-10-30 LAB — CULTURE, GROUP A STREP (THRC)

## 2018-11-30 ENCOUNTER — Ambulatory Visit
Admission: RE | Admit: 2018-11-30 | Discharge: 2018-11-30 | Disposition: A | Discharge status: Home / Self Care | Payer: 59 | Source: Ambulatory Visit | Attending: Family | Admitting: Family

## 2018-11-30 ENCOUNTER — Other Ambulatory Visit: Payer: Self-pay

## 2018-11-30 DIAGNOSIS — Z1231 Encounter for screening mammogram for malignant neoplasm of breast: Secondary | ICD-10-CM

## 2018-12-01 ENCOUNTER — Other Ambulatory Visit: Payer: Self-pay | Admitting: Family

## 2018-12-01 DIAGNOSIS — R928 Other abnormal and inconclusive findings on diagnostic imaging of breast: Secondary | ICD-10-CM

## 2018-12-02 DIAGNOSIS — Z0189 Encounter for other specified special examinations: Secondary | ICD-10-CM | POA: Diagnosis not present

## 2018-12-02 DIAGNOSIS — I10 Essential (primary) hypertension: Secondary | ICD-10-CM | POA: Diagnosis not present

## 2018-12-04 ENCOUNTER — Ambulatory Visit
Admission: RE | Admit: 2018-12-04 | Discharge: 2018-12-04 | Disposition: A | Discharge status: Home / Self Care | Payer: 59 | Source: Ambulatory Visit | Attending: Family | Admitting: Family

## 2018-12-04 ENCOUNTER — Other Ambulatory Visit: Payer: Self-pay

## 2018-12-04 ENCOUNTER — Other Ambulatory Visit: Payer: Self-pay | Admitting: Family

## 2018-12-04 DIAGNOSIS — R928 Other abnormal and inconclusive findings on diagnostic imaging of breast: Secondary | ICD-10-CM

## 2018-12-04 DIAGNOSIS — N63 Unspecified lump in unspecified breast: Secondary | ICD-10-CM

## 2019-01-28 DIAGNOSIS — Z3009 Encounter for other general counseling and advice on contraception: Secondary | ICD-10-CM | POA: Diagnosis not present

## 2019-01-28 DIAGNOSIS — Z3042 Encounter for surveillance of injectable contraceptive: Secondary | ICD-10-CM | POA: Diagnosis not present

## 2019-04-29 DIAGNOSIS — Z3042 Encounter for surveillance of injectable contraceptive: Secondary | ICD-10-CM | POA: Diagnosis not present

## 2019-04-29 DIAGNOSIS — Z3009 Encounter for other general counseling and advice on contraception: Secondary | ICD-10-CM | POA: Diagnosis not present

## 2019-05-05 DIAGNOSIS — I10 Essential (primary) hypertension: Secondary | ICD-10-CM | POA: Diagnosis not present

## 2019-06-08 ENCOUNTER — Inpatient Hospital Stay: Admission: RE | Admit: 2019-06-08 | Payer: 59 | Source: Ambulatory Visit

## 2019-07-30 ENCOUNTER — Encounter (HOSPITAL_COMMUNITY): Payer: Self-pay

## 2019-07-30 ENCOUNTER — Ambulatory Visit (HOSPITAL_COMMUNITY)
Admission: EM | Admit: 2019-07-30 | Discharge: 2019-07-30 | Disposition: A | Discharge status: Home / Self Care | Payer: 59 | Attending: Emergency Medicine | Admitting: Emergency Medicine

## 2019-07-30 ENCOUNTER — Other Ambulatory Visit: Payer: Self-pay

## 2019-07-30 DIAGNOSIS — R519 Headache, unspecified: Secondary | ICD-10-CM | POA: Diagnosis not present

## 2019-07-30 DIAGNOSIS — Z20828 Contact with and (suspected) exposure to other viral communicable diseases: Secondary | ICD-10-CM | POA: Diagnosis not present

## 2019-07-30 DIAGNOSIS — I10 Essential (primary) hypertension: Secondary | ICD-10-CM | POA: Insufficient documentation

## 2019-07-30 DIAGNOSIS — Z8249 Family history of ischemic heart disease and other diseases of the circulatory system: Secondary | ICD-10-CM | POA: Diagnosis not present

## 2019-07-30 HISTORY — DX: Essential (primary) hypertension: I10

## 2019-07-30 LAB — POCT URINALYSIS DIP (DEVICE)
Bilirubin Urine: NEGATIVE
Glucose, UA: NEGATIVE mg/dL
Hgb urine dipstick: NEGATIVE
Ketones, ur: NEGATIVE mg/dL
Leukocytes,Ua: NEGATIVE
Nitrite: NEGATIVE
Protein, ur: NEGATIVE mg/dL
Specific Gravity, Urine: 1.02 (ref 1.005–1.030)
Urobilinogen, UA: 0.2 mg/dL (ref 0.0–1.0)
pH: 7.5 (ref 5.0–8.0)

## 2019-07-30 MED ORDER — KETOROLAC TROMETHAMINE 60 MG/2ML IM SOLN
60.0000 mg | Freq: Once | INTRAMUSCULAR | Status: AC
  Administered 2019-07-30: 60 mg via INTRAMUSCULAR

## 2019-07-30 MED ORDER — KETOROLAC TROMETHAMINE 60 MG/2ML IM SOLN
INTRAMUSCULAR | Status: AC
  Filled 2019-07-30: qty 2

## 2019-07-30 NOTE — ED Provider Notes (Signed)
[place]    CSN: [id number] Arrival date & time: 07/30/19  0847      History   Chief Complaint Chief Complaint  Patient presents with  . Migraine    HPI Kara Cain is a 43 y.o. female.   Kara Cain presents with complaints of headache which started 11/10. 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 She noticed a smell like gas at the time of onset, which felt like it triggered the headache. Denies any current abnormal smell or loss of smell. No dizziness. No vision changes or light sensitivity. No nausea vomiting. No fevers. No URI symptoms. No known ill contacts. Ibuprofen did help, hasn't taken today. Has tried tylenol which hasn't been as helpful. She does take blood pressure medication, takes it at night so has not taken it today. She is uncertain of which medication it is. No leg swelling. No shortness of breath . She is on depo, had spotting last month, hasn't missed a dose. Denies previous migraines, states just occasional headache's. Frontal headache pain.    ROS per HPI, negative if not otherwise mentioned.      Past Medical History:  Diagnosis Date  . Hypertension     Patient Active Problem List   Diagnosis Date Noted  . Spasm of back muscles 04/16/2017  . Routine health maintenance 07/18/2013    Past Surgical History:  Procedure Laterality Date  . WISDOM TOOTH EXTRACTION      OB History   No obstetric history on file.      Home Medications    Prior to Admission medications   Medication Sig Start Date End Date Taking? Authorizing Provider  ibuprofen (ADVIL,MOTRIN) 600 MG tablet Take 1 tablet (600 mg total) by mouth every 6 (six) hours as needed. 10/28/18   Wieters, Elesa Hacker, PA-C    Family History Family History  Problem Relation Age of Onset  . Hypertension Mother   . Breast cancer Neg Hx     Social History Social History   Tobacco Use  . Smoking status: Never Smoker  .  Smokeless tobacco: Never Used  Substance Use Topics  . Alcohol use: Yes  . Drug use: Never     Allergies   Hydrocodone   Review of Systems Review of Systems   Physical Exam Triage Vital Signs ED Triage Vitals [07/30/19 0917]  Enc Vitals Group     BP (!) 170/97     Pulse Rate 91     Resp 17     Temp 97.7 F (36.5 C)     Temp Source Oral     SpO2 98 %     Weight      Height      Head Circumference      Peak Flow      Pain Score 8     Pain Loc      Pain Edu?      Excl. in [place]?    No data found.  Updated Vital Signs BP (!) 149/94 (BP Location: Left Arm)   Pulse 91   Temp 97.7 F (36.5 C) (Oral)   Resp 17   SpO2 98%    Physical Exam Constitutional:      General: She is not in acute distress.    Appearance: She is well-developed.  HENT:     Head: Normocephalic and atraumatic.  Eyes:     Extraocular Movements: Extraocular movements intact.  Conjunctiva/sclera: Conjunctivae normal.     Pupils: Pupils are equal, round, and reactive to light.  Cardiovascular:     Rate and Rhythm: Normal rate and regular rhythm.     Heart sounds: Normal heart sounds.  Pulmonary:     Effort: Pulmonary effort is normal.     Breath sounds: Normal breath sounds.  Musculoskeletal:     Right lower leg: No edema.     Left lower leg: No edema.  Skin:    General: Skin is warm and dry.  Neurological:     General: No focal deficit present.     Mental Status: She is alert and oriented to person, place, and time.     Cranial Nerves: No cranial nerve deficit.     Motor: No weakness.     Gait: Gait normal.  Psychiatric:        Mood and Affect: Mood normal.      UC Treatments / Results  Labs (all labs ordered are listed, but only abnormal results are displayed) Labs Reviewed  NOVEL CORONAVIRUS, NAA (HOSP ORDER, SEND-OUT TO REF LAB; TAT 18-24 HRS)  POCT URINALYSIS DIP (DEVICE)    EKG   Radiology No results found.  Procedures Procedures (including critical care time)   Medications Ordered in UC Medications  ketorolac (TORADOL) injection 60 mg (60 mg Intramuscular Given 07/30/19 0955)  ketorolac (TORADOL) 60 MG/2ML injection (has no administration in time range)    Initial Impression / Assessment and Plan / UC Course  I have reviewed the triage vital signs and the nursing notes.  Pertinent labs & imaging results that were available during my care of the patient were reviewed by me and considered in my medical decision making (see chart for details).     Mild improvement in bp over time and after toradol for headache. Headache causing htn vs htn causing headache discussed with patient. Encouraged recheck of BP with pcp. Return precautions provided. covid testing also collected and pending with this new headache, as patient does work in health care. Will notify of any positive findings and if any changes to treatment are needed.  Patient verbalized understanding and agreeable to plan.    Final Clinical Impressions(s) / UC Diagnoses   Final diagnoses:  Bad headache     Discharge Instructions     I am glad to see your blood pressure has come down.  May take Benadryl once home as well to promote sleep and help with headache.  Go home, rest in quiet dark room. Limit screen time.  Drink plenty of water to ensure adequate hydration.   May repeat ibuprofen in another 8 hours, or may use tylenol if needed.  Self isolate until covid results are back and negative.  Will notify you by phone of any positive findings. Your negative results will be sent through your MyChart.     Please follow up with your primary care provider for BP recheck.  Please return if worsening of headache, vision changes, dizziness, chest pain , leg swelling, or otherwise worsening.     ED Prescriptions    None     PDMP not reviewed this encounter.   Georgetta Haber, NP 07/30/19 1252

## 2019-07-30 NOTE — Discharge Instructions (Signed)
I am glad to see your blood pressure has come down.  May take Benadryl once home as well to promote sleep and help with headache.  Go home, rest in quiet dark room. Limit screen time.  Drink plenty of water to ensure adequate hydration.   May repeat ibuprofen in another 8 hours, or may use tylenol if needed.  Self isolate until covid results are back and negative.  Will notify you by phone of any positive findings. Your negative results will be sent through your MyChart.     Please follow up with your primary care provider for BP recheck.  Please return if worsening of headache, vision changes, dizziness, chest pain , leg swelling, or otherwise worsening.

## 2019-07-30 NOTE — ED Triage Notes (Signed)
Pt presents with ongoing migraine since Tuesday.

## 2019-08-01 LAB — NOVEL CORONAVIRUS, NAA (HOSP ORDER, SEND-OUT TO REF LAB; TAT 18-24 HRS): SARS-CoV-2, NAA: NOT DETECTED

## 2019-08-03 DIAGNOSIS — Z3009 Encounter for other general counseling and advice on contraception: Secondary | ICD-10-CM | POA: Diagnosis not present

## 2019-08-03 DIAGNOSIS — Z3042 Encounter for surveillance of injectable contraceptive: Secondary | ICD-10-CM | POA: Diagnosis not present

## 2019-08-28 DIAGNOSIS — R519 Headache, unspecified: Secondary | ICD-10-CM | POA: Diagnosis not present

## 2019-08-28 DIAGNOSIS — M542 Cervicalgia: Secondary | ICD-10-CM | POA: Diagnosis not present

## 2019-08-30 DIAGNOSIS — S134XXA Sprain of ligaments of cervical spine, initial encounter: Secondary | ICD-10-CM | POA: Diagnosis not present

## 2019-08-30 DIAGNOSIS — Z041 Encounter for examination and observation following transport accident: Secondary | ICD-10-CM | POA: Diagnosis not present

## 2019-11-03 DIAGNOSIS — Z3009 Encounter for other general counseling and advice on contraception: Secondary | ICD-10-CM | POA: Diagnosis not present

## 2019-11-03 DIAGNOSIS — Z3042 Encounter for surveillance of injectable contraceptive: Secondary | ICD-10-CM | POA: Diagnosis not present

## 2019-11-19 ENCOUNTER — Ambulatory Visit (INDEPENDENT_AMBULATORY_CARE_PROVIDER_SITE_OTHER): Payer: 59 | Admitting: Family Medicine

## 2019-11-19 ENCOUNTER — Other Ambulatory Visit: Payer: Self-pay

## 2019-11-19 ENCOUNTER — Encounter: Payer: Self-pay | Admitting: Family Medicine

## 2019-11-19 DIAGNOSIS — M25511 Pain in right shoulder: Secondary | ICD-10-CM | POA: Diagnosis not present

## 2019-11-19 DIAGNOSIS — M542 Cervicalgia: Secondary | ICD-10-CM
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
## 2019-11-29 DIAGNOSIS — I10 Essential (primary) hypertension: Secondary | ICD-10-CM | POA: Diagnosis not present

## 2019-12-18 ENCOUNTER — Ambulatory Visit
Admission: RE | Admit: 2019-12-18 | Discharge: 2019-12-18 | Disposition: A | Discharge status: Home / Self Care | Payer: 59 | Source: Ambulatory Visit | Attending: Family Medicine | Admitting: Family Medicine

## 2019-12-18 DIAGNOSIS — M25511 Pain in right shoulder: Secondary | ICD-10-CM | POA: Diagnosis not present

## 2019-12-18 DIAGNOSIS — M542 Cervicalgia: Secondary | ICD-10-CM | POA: Diagnosis not present

## 2019-12-18 DIAGNOSIS — S199XXA Unspecified injury of neck, initial encounter: Secondary | ICD-10-CM | POA: Diagnosis not present

## 2019-12-20 ENCOUNTER — Telehealth: Payer: Self-pay | Admitting: Family Medicine

## 2019-12-20 NOTE — Telephone Encounter (Signed)
Neck MRI looks normal.  Shoulder MRI shows rotator cuff tendinopathy with small partial tearing.  This will often heal without surgery, but sometimes will require arthroscopic surgery with rotator cuff repair.   Treatment options for shoulder include:  - Trial of physical therapy exercises. - Cortisone injection. - Consultation with surgeon.

## 2019-12-21 ENCOUNTER — Ambulatory Visit (INDEPENDENT_AMBULATORY_CARE_PROVIDER_SITE_OTHER): Payer: 59 | Admitting: Family Medicine

## 2019-12-21 ENCOUNTER — Encounter: Payer: Self-pay | Admitting: Family Medicine

## 2019-12-21 ENCOUNTER — Other Ambulatory Visit: Payer: Self-pay

## 2019-12-21 DIAGNOSIS — M25511 Pain in right shoulder: Secondary | ICD-10-CM | POA: Diagnosis not present

## 2019-12-21 DIAGNOSIS — M542 Cervicalgia: Secondary | ICD-10-CM
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
## 2020-01-18 ENCOUNTER — Ambulatory Visit: Payer: 59 | Admitting: Family Medicine

## 2020-02-02 DIAGNOSIS — Z3042 Encounter for surveillance of injectable contraceptive: Secondary | ICD-10-CM | POA: Diagnosis not present

## 2020-02-02 DIAGNOSIS — Z3009 Encounter for other general counseling and advice on contraception: Secondary | ICD-10-CM | POA: Diagnosis not present

## 2020-02-03 ENCOUNTER — Other Ambulatory Visit: Payer: Self-pay

## 2020-02-03 ENCOUNTER — Encounter: Payer: Self-pay | Admitting: Family Medicine

## 2020-02-03 ENCOUNTER — Ambulatory Visit (INDEPENDENT_AMBULATORY_CARE_PROVIDER_SITE_OTHER): Payer: 59 | Admitting: Family Medicine

## 2020-02-03 DIAGNOSIS — M542 Cervicalgia: Secondary | ICD-10-CM

## 2020-02-03 DIAGNOSIS — M25511 Pain in right shoulder: Secondary | ICD-10-CM
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
## 2020-04-14 ENCOUNTER — Ambulatory Visit (HOSPITAL_COMMUNITY)
Admission: EM | Admit: 2020-04-14 | Discharge: 2020-04-14 | Disposition: A | Discharge status: Home / Self Care | Payer: 59 | Attending: Emergency Medicine | Admitting: Emergency Medicine

## 2020-04-14 ENCOUNTER — Encounter (HOSPITAL_COMMUNITY): Payer: Self-pay

## 2020-04-14 ENCOUNTER — Other Ambulatory Visit: Payer: Self-pay

## 2020-04-14 DIAGNOSIS — R519 Headache, unspecified: Secondary | ICD-10-CM

## 2020-04-14 MED ORDER — ONDANSETRON 4 MG PO TBDP
ORAL_TABLET | ORAL | Status: AC
  Filled 2020-04-14: qty 1

## 2020-04-14 MED ORDER — KETOROLAC TROMETHAMINE 60 MG/2ML IM SOLN
INTRAMUSCULAR | Status: AC
  Filled 2020-04-14: qty 2

## 2020-04-14 MED ORDER — KETOROLAC TROMETHAMINE 60 MG/2ML IM SOLN
60.0000 mg | Freq: Once | INTRAMUSCULAR | Status: AC
  Administered 2020-04-14: 60 mg via INTRAMUSCULAR

## 2020-04-14 MED ORDER — ONDANSETRON 4 MG PO TBDP
4.0000 mg | ORAL_TABLET | Freq: Once | ORAL | Status: AC
  Administered 2020-04-14: 4 mg via ORAL

## 2020-04-14 NOTE — Discharge Instructions (Signed)
May take Benadryl as well to promote sleep and help with headache.  Go home, rest in quiet dark room. Limit screen time.  Drink plenty of water to ensure adequate hydration.   If symptoms worsen or do not improve in the next week to return to be seen or to follow up with your PCP.   

## 2020-04-14 NOTE — ED Triage Notes (Signed)
Headache x 3 days, taking Tylenol without relief. Pt hypertensive in triage, states she takes her amlodipine at night and did take last night

## 2020-04-15 NOTE — ED Provider Notes (Signed)
[facility]    CSN: [id number] Arrival date & time: 04/14/20  5885      History   Chief Complaint Chief Complaint  Patient presents with   Headache    HPI Kara Cain is a 44 y.o. female.   Kara Cain presents with complaints of headache. She has had the headache for the past few days now. Feels similar to other headaches she has had in the past but this is lasting longer than normal for her. No dizziness, no vision change, no nausea or vomiting. No fevers. No chest pain , no shortness of breath  No leg swelling. She recently restarted taking her bp medication, takes it at night so hasn't taken it yet today. Her sister passed away last week so she has had increased stress/emotions related to this. Light sensitivity. Took tylenol, none today, and it hasn't helped.    ROS per HPI, negative if not otherwise mentioned.      Past Medical History:  Diagnosis Date   Hypertension     Patient Active Problem List   Diagnosis Date Noted   Spasm of back muscles 04/16/2017   Routine health maintenance 07/18/2013    Past Surgical History:  Procedure Laterality Date   WISDOM TOOTH EXTRACTION      OB History   No obstetric history on file.      Home Medications    Prior to Admission medications   Medication Sig Start Date End Date Taking? Authorizing Provider  Acetaminophen (TYLENOL) 325 MG CAPS Take by mouth as needed.    [provider]  amLODipine (NORVASC) 10 MG tablet  11/29/19   [provider]    Family History Family History  Problem Relation Age of Onset   Hypertension Mother    Breast cancer Neg Hx     Social History Social History   Tobacco Use   Smoking status: Never Smoker   Smokeless tobacco: Never Used  Substance Use Topics   Alcohol use: Yes   Drug use: Never     Allergies   Hydrocodone   Review of Systems Review of Systems   Physical Exam Triage Vital Signs ED Triage Vitals  [04/14/20 0904]  Enc Vitals Group     BP (!) 167/114     Pulse Rate 90     Resp 16     Temp 98 F (36.7 C)     Temp src      SpO2 100 %     Weight      Height      Head Circumference      Peak Flow      Pain Score 10     Pain Loc      Pain Edu?      Excl. in [place]?    No data found.  Updated Vital Signs BP (!) 167/114 Comment: Pt takes BP meds at night, did take last night   Pulse 90    Temp 98 F (36.7 C)    Resp 16    SpO2 100%   Visual Acuity Right Eye Distance:   Left Eye Distance:   Bilateral Distance:    Right Eye Near:   Left Eye Near:    Bilateral Near:     Physical Exam Constitutional:      General: She is not in acute distress.    Appearance: She is well-developed.     Comments: Uncomfortable, sitting in exam room with lights off for comfort  Eyes:     Extraocular Movements: Extraocular movements intact.     Pupils: Pupils are equal, round, and reactive to light.  Cardiovascular:     Rate and Rhythm: Normal rate.  Pulmonary:     Effort: Pulmonary effort is normal.  Skin:    General: Skin is warm and dry.  Neurological:     Mental Status: She is alert and oriented to person, place, and time.     Cranial Nerves: No cranial nerve deficit or dysarthria.     Sensory: No sensory deficit.     Motor: No weakness.     Coordination: Coordination normal.  Psychiatric:        Mood and Affect: Mood normal.        Speech: Speech normal.      UC Treatments / Results  Labs (all labs ordered are listed, but only abnormal results are displayed) Labs Reviewed - No data to display  EKG   Radiology No results found.  Procedures Procedures (including critical care time)  Medications Ordered in UC Medications  ondansetron (ZOFRAN-ODT) disintegrating tablet 4 mg (4 mg Oral Given 04/14/20 1003)  ketorolac (TORADOL) injection 60 mg (60 mg Intramuscular Given 04/14/20 1003)    Initial Impression / Assessment and Plan / UC Course  I have reviewed the triage  vital signs and the nursing notes.  Pertinent labs & imaging results that were available during my care of the patient were reviewed by me and considered in my medical decision making (see chart for details).    Noted elevation in bp. Hasn't taken RX today, recently restarted. No red flag findings on exam. Increased stress recently related to the passing of her sister. Headache pain management provided and discussed. Return precautions provided. Patient verbalized understanding and agreeable to plan.   Final Clinical Impressions(s) / UC Diagnoses   Final diagnoses:  Bad headache     Discharge Instructions     May take Benadryl as well to promote sleep and help with headache.  Go home, rest in quiet dark room. Limit screen time.  Drink plenty of water to ensure adequate hydration.   If symptoms worsen or do not improve in the next week to return to be seen or to follow up with your PCP.     ED Prescriptions    None     PDMP not reviewed this encounter.   Georgetta Haber, NP 04/15/20 2007

## 2020-05-03 DIAGNOSIS — Z01419 Encounter for gynecological examination (general) (routine) without abnormal findings: Secondary | ICD-10-CM | POA: Diagnosis not present

## 2020-05-03 DIAGNOSIS — Z3042 Encounter for surveillance of injectable contraceptive: Secondary | ICD-10-CM | POA: Diagnosis not present

## 2020-05-03 DIAGNOSIS — I10 Essential (primary) hypertension: Secondary | ICD-10-CM | POA: Diagnosis not present

## 2020-05-03 DIAGNOSIS — R87610 Atypical squamous cells of undetermined significance on cytologic smear of cervix (ASC-US): Secondary | ICD-10-CM | POA: Diagnosis not present

## 2020-05-05 ENCOUNTER — Other Ambulatory Visit: Payer: Self-pay

## 2020-05-05 ENCOUNTER — Ambulatory Visit (INDEPENDENT_AMBULATORY_CARE_PROVIDER_SITE_OTHER): Payer: 59 | Admitting: Family Medicine

## 2020-05-05 ENCOUNTER — Encounter: Payer: Self-pay | Admitting: Family Medicine

## 2020-05-05 DIAGNOSIS — M542 Cervicalgia: Secondary | ICD-10-CM | POA: Diagnosis not present

## 2020-05-05 DIAGNOSIS — M25511 Pain in right shoulder: Secondary | ICD-10-CM | POA: Diagnosis not present
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
## 2020-06-19 ENCOUNTER — Ambulatory Visit: Payer: 59 | Admitting: Obstetrics and Gynecology

## 2020-08-03 ENCOUNTER — Encounter (HOSPITAL_COMMUNITY): Payer: Self-pay | Admitting: Emergency Medicine

## 2020-08-03 ENCOUNTER — Other Ambulatory Visit: Payer: Self-pay

## 2020-08-03 ENCOUNTER — Emergency Department (HOSPITAL_COMMUNITY)
Admission: EM | Admit: 2020-08-03 | Discharge: 2020-08-03 | Disposition: A | Discharge status: Home / Self Care | Payer: BC Managed Care – PPO | Attending: Emergency Medicine | Admitting: Emergency Medicine

## 2020-08-03 ENCOUNTER — Emergency Department (HOSPITAL_COMMUNITY): Payer: BC Managed Care – PPO

## 2020-08-03 DIAGNOSIS — K625 Hemorrhage of anus and rectum: Secondary | ICD-10-CM | POA: Diagnosis not present

## 2020-08-03 DIAGNOSIS — R110 Nausea: Secondary | ICD-10-CM

## 2020-08-03 DIAGNOSIS — R112 Nausea with vomiting, unspecified: Secondary | ICD-10-CM | POA: Insufficient documentation

## 2020-08-03 DIAGNOSIS — K649 Unspecified hemorrhoids: Secondary | ICD-10-CM

## 2020-08-03 DIAGNOSIS — Z79899 Other long term (current) drug therapy: Secondary | ICD-10-CM | POA: Insufficient documentation

## 2020-08-03 DIAGNOSIS — I10 Essential (primary) hypertension: Secondary | ICD-10-CM | POA: Insufficient documentation

## 2020-08-03 DIAGNOSIS — R109 Unspecified abdominal pain: Secondary | ICD-10-CM | POA: Diagnosis present

## 2020-08-03 DIAGNOSIS — K921 Melena: Secondary | ICD-10-CM

## 2020-08-03 LAB — CBC
HCT: 32 % — ABNORMAL LOW (ref 36.0–46.0)
Hemoglobin: 9.4 g/dL — ABNORMAL LOW (ref 12.0–15.0)
MCH: 22.2 pg — ABNORMAL LOW (ref 26.0–34.0)
MCHC: 29.4 g/dL — ABNORMAL LOW (ref 30.0–36.0)
MCV: 75.7 fL — ABNORMAL LOW (ref 80.0–100.0)
Platelets: 438 10*3/uL — ABNORMAL HIGH (ref 150–400)
RBC: 4.23 MIL/uL (ref 3.87–5.11)
RDW: 20 % — ABNORMAL HIGH (ref 11.5–15.5)
WBC: 8 10*3/uL (ref 4.0–10.5)
nRBC: 0 % (ref 0.0–0.2)

## 2020-08-03 LAB — COMPREHENSIVE METABOLIC PANEL
ALT: 19 U/L (ref 0–44)
AST: 20 U/L (ref 15–41)
Albumin: 3.5 g/dL (ref 3.5–5.0)
Alkaline Phosphatase: 48 U/L (ref 38–126)
Anion gap: 10 (ref 5–15)
BUN: 11 mg/dL (ref 6–20)
CO2: 26 mmol/L (ref 22–32)
Calcium: 9.6 mg/dL (ref 8.9–10.3)
Chloride: 103 mmol/L (ref 98–111)
Creatinine, Ser: 1.15 mg/dL — ABNORMAL HIGH (ref 0.44–1.00)
GFR, Estimated: 60 mL/min — ABNORMAL LOW (ref >60)
Glucose, Bld: 114 mg/dL — ABNORMAL HIGH (ref 70–99)
Potassium: 4.4 mmol/L (ref 3.5–5.1)
Sodium: 139 mmol/L (ref 135–145)
Total Bilirubin: 0.5 mg/dL (ref 0.3–1.2)
Total Protein: 7.3 g/dL (ref 6.5–8.1)

## 2020-08-03 LAB — POC OCCULT BLOOD, ED: Fecal Occult Bld: POSITIVE — AB

## 2020-08-03 LAB — I-STAT BETA HCG BLOOD, ED (MC, WL, AP ONLY): I-stat hCG, quantitative: <5 m[IU]/mL (ref <5)

## 2020-08-03 LAB — TYPE AND SCREEN
ABO/RH(D): B POS
Antibody Screen: NEGATIVE

## 2020-08-03 MED ORDER — SODIUM CHLORIDE 0.9 % IV BOLUS
1000.0000 mL | Freq: Once | INTRAVENOUS | Status: AC
  Administered 2020-08-03: 1000 mL via INTRAVENOUS

## 2020-08-03 MED ORDER — IOHEXOL 300 MG/ML  SOLN
100.0000 mL | Freq: Once | INTRAMUSCULAR | Status: AC | PRN
  Administered 2020-08-03: 100 mL via INTRAVENOUS

## 2020-08-03 MED ORDER — ONDANSETRON 4 MG PO TBDP: 4.0000 mg | ORAL_TABLET | Freq: Three times a day (TID) | ORAL | 0 refills | Status: DC | PRN

## 2020-08-03 MED ORDER — FENTANYL CITRATE (PF) 100 MCG/2ML IJ SOLN
50.0000 ug | Freq: Once | INTRAMUSCULAR | Status: AC
  Administered 2020-08-03: 50 ug via INTRAVENOUS
  Filled 2020-08-03: qty 2

## 2020-08-03 MED ORDER — ONDANSETRON HCL 4 MG/2ML IJ SOLN
4.0000 mg | Freq: Once | INTRAMUSCULAR | Status: AC
  Administered 2020-08-03: 4 mg via INTRAVENOUS
  Filled 2020-08-03: qty 2

## 2020-08-03 NOTE — Discharge Instructions (Signed)
Please call the number provided for gastroenterology.  Please schedule an appointment to discuss your symptoms from today and further testing.  Please call your primary care doctor to get an appointment with them ideally for the next couple days to have your blood work rechecked and your hemoglobin levels monitored.  If you have any episodes of blood in stool, vomiting, lightheadedness, passing out or other new concerning symptom, return to ER for reassessment.  For your hemorrhoid recommend trying sitz bath.

## 2020-08-03 NOTE — ED Triage Notes (Signed)
Pt reports taking her pills on an empty stomach Monday and started throwing up. Tuesday, she started having generalized abd pain and rectal bleeding. Unable to keep BP meds down. Pt had depo shot yesterday.

## 2020-08-03 NOTE — ED Provider Notes (Signed)
[facility] EMERGENCY DEPARTMENT Provider Note   CSN: [id number] Arrival date & time: 08/03/20  1437     History Chief Complaint  Patient presents with  . Abdominal Pain  . Rectal Bleeding    Kara Cain is a 44 y.o. female. Concern for abdominal pain nausea vomiting and blood in stool. Reports that over the past couple days she has had occasional episodes of bright red blood per rectum. No episodes today. Thinks that she may have seen small streak of blood in her stool. Also has been having abdominal pain. Worse on left side, also having some pain around her rectum. Sharp stabbing nonradiating, no pelvic pain, no blood in urine or vaginal bleeding. Takes Depo for birth control. Reports that she was told that her iron levels were low and she started taking an iron supplement recently. Unsure of what her hemoglobin levels were. Not on blood thinners, denies prior history of GI illness or GI bleed. Reports she has known history of hemorrhoids.  HPI     Past Medical History:  Diagnosis Date  . Hypertension     Patient Active Problem List   Diagnosis Date Noted  . Spasm of back muscles 04/16/2017  . Routine health maintenance 07/18/2013    Past Surgical History:  Procedure Laterality Date  . WISDOM TOOTH EXTRACTION       OB History   No obstetric history on file.     Family History  Problem Relation Age of Onset  . Hypertension Mother   . Breast cancer Neg Hx     Social History   Tobacco Use  . Smoking status: Never Smoker  . Smokeless tobacco: Never Used  Substance Use Topics  . Alcohol use: Yes  . Drug use: Never    Home Medications Prior to Admission medications   Medication Sig Start Date End Date Taking? Authorizing Provider  Acetaminophen (TYLENOL) 325 MG CAPS Take by mouth as needed.    [provider]  amLODipine (NORVASC) 10 MG tablet  11/29/19   [provider]  ondansetron (ZOFRAN ODT) 4 MG disintegrating  tablet Take 1 tablet (4 mg total) by mouth every 8 (eight) hours as needed for nausea or vomiting. 08/03/20   Milagros Loll, MD    Allergies    Hydrocodone  Review of Systems   Review of Systems  Constitutional: Negative for chills and fever.  HENT: Negative for ear pain and sore throat.   Eyes: Negative for pain and visual disturbance.  Respiratory: Negative for cough and shortness of breath.   Cardiovascular: Negative for chest pain and palpitations.  Gastrointestinal: Positive for abdominal pain, blood in stool, nausea and vomiting.  Genitourinary: Negative for dysuria and hematuria.  Musculoskeletal: Negative for arthralgias and back pain.  Skin: Negative for color change and rash.  Neurological: Negative for seizures and syncope.  All other systems reviewed and are negative.   Physical Exam Updated Vital Signs BP (!) 151/96   Pulse 85   Temp 98.2 F (36.8 C) (Oral)   Resp 12   Ht 5\' 4"  (1.626 m)   Wt 69.9 kg   SpO2 100%   BMI 26.43 kg/m   Physical Exam Vitals and nursing note reviewed.  Constitutional:      General: She is not in acute distress.    Appearance: She is well-developed.  HENT:     Head: Normocephalic and atraumatic.  Eyes:     Conjunctiva/sclera: Conjunctivae normal.  Cardiovascular:  Rate and Rhythm: Normal rate and regular rhythm.     Heart sounds: No murmur heard.   Pulmonary:     Effort: Pulmonary effort is normal. No respiratory distress.     Breath sounds: Normal breath sounds.  Abdominal:     Palpations: Abdomen is soft.     Tenderness: There is no abdominal tenderness.  Musculoskeletal:     Cervical back: Neck supple.  Skin:    General: Skin is warm and dry.     Capillary Refill: Capillary refill takes less than 2 seconds.  Neurological:     Mental Status: She is alert.     ED Results / Procedures / Treatments   Labs (all labs ordered are listed, but only abnormal results are displayed) Labs Reviewed  COMPREHENSIVE  METABOLIC PANEL - Abnormal; Notable for the following components:      Result Value   Glucose, Bld 114 (*)    Creatinine, Ser 1.15 (*)    GFR, Estimated 60 (*)    All other components within normal limits  CBC - Abnormal; Notable for the following components:   Hemoglobin 9.4 (*)    HCT 32.0 (*)    MCV 75.7 (*)    MCH 22.2 (*)    MCHC 29.4 (*)    RDW 20.0 (*)    Platelets 438 (*)    All other components within normal limits  POC OCCULT BLOOD, ED - Abnormal; Notable for the following components:   Fecal Occult Bld POSITIVE (*)    All other components within normal limits  I-STAT BETA HCG BLOOD, ED ([facility], [facility], [facility] ONLY)  TYPE AND SCREEN  ABO/RH    EKG None  Radiology CT ABDOMEN PELVIS W CONTRAST  Result Date: 08/03/2020 CLINICAL DATA:  Left lower quadrant pain EXAM: CT ABDOMEN AND PELVIS WITH CONTRAST TECHNIQUE: Multidetector CT imaging of the abdomen and pelvis was performed using the standard protocol following bolus administration of intravenous contrast. CONTRAST:  OMNIPAQUE IOHEXOL 300 MG/ML  SOLN COMPARISON:  None. FINDINGS: Lower chest: No acute abnormality. Hepatobiliary: No focal hepatic abnormality. Gallbladder unremarkable. Pancreas: No focal abnormality or ductal dilatation. Spleen: No focal abnormality.  Normal size. Adrenals/Urinary Tract: No adrenal abnormality. No focal renal abnormality. No stones or hydronephrosis. Urinary bladder is unremarkable. Stomach/Bowel: Left colonic diverticulosis. No active diverticulitis. Normal appendix. Stomach and small bowel decompressed, unremarkable. Vascular/Lymphatic: No evidence of aneurysm or adenopathy. Reproductive: Small exophytic fibroid off the anterior uterus, 11 mm. No adnexal masses. Other: No free fluid or free air. Musculoskeletal: No acute bony abnormality. IMPRESSION: No acute findings in the abdomen or pelvis. Scattered left colonic diverticulosis.  No active diverticulitis. Electronically Signed   By: Charlett Nose  M.D.   On: 08/03/2020 18:53    Procedures Procedures (including critical care time)  Medications Ordered in ED Medications  sodium chloride 0.9 % bolus 1,000 mL (0 mLs Intravenous Stopped 08/03/20 1915)  fentaNYL (SUBLIMAZE) injection 50 mcg (50 mcg Intravenous Given 08/03/20 1704)  ondansetron (ZOFRAN) injection 4 mg (4 mg Intravenous Given 08/03/20 1702)  iohexol (OMNIPAQUE) 300 MG/ML solution 100 mL (100 mLs Intravenous Contrast Given 08/03/20 1821)    ED Course  I have reviewed the triage vital signs and the nursing notes.  Pertinent labs & imaging results that were available during my care of the patient were reviewed by me and considered in my medical decision making (see chart for details).    MDM Rules/Calculators/A&P  44 year old lady presents to ER with abdominal pain, blood in stool.  On exam patient well-appearing, initially noted to be hypertensive and tachycardic.  Abdomen soft.  Nonthrombosed hemorrhoid on rectal exam, brown stool but Hemoccult positive.  Her labs are notable for hemoglobin of 9.4, no prior available for comparison.  Patient reports that she has been told previously that she has low blood counts and had been on an iron supplement.  Her BUN is normal, she has no active or ongoing episodes in ER.  After receiving fluids pain and nausea medicine her tachycardia had completely resolved.  CT scan was negative for acute process.  Believe she is appropriate for outpatient management at this time.  Recommend she follow-up with gastroenterology for further evaluation.  Recommended she follow-up with her primary doctor in the interim for repeat blood work in the next few days.  Provided strict return precautions, Zofran as needed.  After the discussed management above, the patient was determined to be safe for discharge.  The patient was in agreement with this plan and all questions regarding their care were answered.  ED return precautions were  discussed and the patient will return to the ED with any significant worsening of condition.  Final Clinical Impression(s) / ED Diagnoses Final diagnoses:  Blood in stool  Hemorrhoids, unspecified hemorrhoid type  Nausea    Rx / DC Orders ED Discharge Orders         Ordered    ondansetron (ZOFRAN ODT) 4 MG disintegrating tablet  Every 8 hours PRN        08/03/20 1930           Milagros Loll, MD 08/03/20 1934

## 2020-08-14 ENCOUNTER — Ambulatory Visit (INDEPENDENT_AMBULATORY_CARE_PROVIDER_SITE_OTHER): Payer: BC Managed Care – PPO | Admitting: Obstetrics and Gynecology

## 2020-08-14 ENCOUNTER — Encounter: Payer: Self-pay | Admitting: Obstetrics and Gynecology

## 2020-08-14 ENCOUNTER — Other Ambulatory Visit: Payer: Self-pay

## 2020-08-14 ENCOUNTER — Other Ambulatory Visit (HOSPITAL_COMMUNITY)
Admission: RE | Admit: 2020-08-14 | Discharge: 2020-08-14 | Disposition: A | Discharge status: Home / Self Care | Payer: BC Managed Care – PPO | Source: Ambulatory Visit | Attending: Obstetrics and Gynecology | Admitting: Obstetrics and Gynecology

## 2020-08-14 DIAGNOSIS — R87610 Atypical squamous cells of undetermined significance on cytologic smear of cervix (ASC-US): Secondary | ICD-10-CM | POA: Insufficient documentation

## 2020-08-14 DIAGNOSIS — Z3202 Encounter for pregnancy test, result negative: Secondary | ICD-10-CM

## 2020-08-14 DIAGNOSIS — R87810 Cervical high risk human papillomavirus (HPV) DNA test positive: Secondary | ICD-10-CM | POA: Diagnosis not present

## 2020-08-14 LAB — POCT PREGNANCY, URINE: Preg Test, Ur: NEGATIVE

## 2020-08-14 NOTE — Patient Instructions (Signed)
Colposcopy, Care After This sheet gives you information about how to care for yourself after your procedure. Your doctor may also give you more specific instructions. If you have problems or questions, contact your doctor. What can I expect after the procedure? If you did not have a tissue sample removed (did not have a biopsy), you may only have some spotting for a few days. You can go back to your normal activities. If you had a tissue sample removed, it is common to have:  Soreness and pain. This may last for a few days.  Light-headedness.  Mild bleeding from your vagina or dark-colored, grainy discharge from your vagina. This may last for a few days. You may need to wear a sanitary pad.  Spotting for at least 48 hours after the procedure. Follow these instructions at home:   Take over-the-counter and prescription medicines only as told by your doctor. Ask your doctor what medicines you can start taking again. This is very important if you take blood-thinning medicine.  Do not drive or use heavy machinery while taking prescription pain medicine.  For 3 days, or as long as your doctor tells you, avoid: ? Douching. ? Using tampons. ? Having sex.  If you use birth control (contraception), keep using it.  Limit activity for the first day after the procedure. Ask your doctor what activities are safe for you.  It is up to you to get the results of your procedure. Ask your doctor when your results will be ready.  Keep all follow-up visits as told by your doctor. This is important. Contact a doctor if:  You get a skin rash. Get help right away if:  You are bleeding a lot from your vagina. It is a lot of bleeding if you are using more than one pad an hour for 2 hours in a row.  You have clumps of blood (blood clots) coming from your vagina.  You have a fever.  You have chills  You have pain in your lower belly (pelvic area).  You have signs of infection, such as vaginal  discharge that is: ? Different than usual. ? Yellow. ? Bad-smelling.  You have very pain or cramps in your lower belly that do not get better with medicine.  You feel light-headed.  You feel dizzy.  You pass out (faint). Summary  If you did not have a tissue sample removed (did not have a biopsy), you may only have some spotting for a few days. You can go back to your normal activities.  If you had a tissue sample removed, it is common to have mild pain and spotting for 48 hours.  For 3 days, or as long as your doctor tells you, avoid douching, using tampons and having sex.  Get help right away if you have bleeding, very bad pain, or signs of infection. This information is not intended to replace advice given to you by your health care provider. Make sure you discuss any questions you have with your health care provider. Document Revised: 08/15/2017 Document Reviewed: 05/22/2016 Elsevier Patient Education  2020 [employer] Colposcopy Colposcopy is a procedure to examine the lowest part of the uterus (cervix), the vagina, and the area around the vaginal opening (vulva) for abnormalities or signs of disease. The procedure is done using a lighted microscope or magnifying lens (colposcope). If any unusual cells are found during the procedure, your health care provider may remove a tissue sample for testing (biopsy). A colposcopy may be done  if you:  Have an abnormal Pap test. A Pap test is a screening test that is used to check for signs of cancer or infection of the vagina, cervix, and uterus.  Have a Pap smear test in which you test positive for high-risk HPV (human papillomavirus).  Have a sore or lesion on your cervix.  Have genital warts on your vulva, vagina, or cervix.  Took certain medicines while pregnant, such as diethylstilbestrol (DES).  Have pain during sexual intercourse.  Have vaginal bleeding, especially after sexual intercourse.  Need to have a cervical polyp  removed.  Need to have a lost intrauterine device (IUD) string located. Let your health care provider know about:  Any allergies you have, including allergies to prescribed medicine, latex, or iodine.  All medicines you are taking, including vitamins, herbs, eye drops, creams, and over-the-counter medicines. Bring a list of all of your medicines to your appointment.  Any problems you or family members have had with anesthetic medicines.  Any blood disorders you have.  Any surgeries you have had.  Any medical conditions you have, such as pelvic inflammatory disease (PID) or endometrial disorder.  Any history of frequent fainting.  Your menstrual cycle and what form of birth control (contraception) you use.  Your medical history, including any prior cervical treatment.  Whether you are pregnant or may be pregnant. What are the risks? Generally, this is a safe procedure. However, problems may occur, including:  Pain.  Infection, which may include a fever, bad-smelling discharge, or pelvic pain.  Bleeding or discharge.  Misdiagnosis.  Fainting and vasovagal reactions, but this is rare.  Allergic reactions to medicines.  Damage to other structures or organs. What happens before the procedure?  If you have your menstrual period or will have it at the time of your procedure, tell your health care provider. A colposcopy typically is not done during menstruation.  Continue your contraceptive practices before and after the procedure.  For 24 hours before the colposcopy: ? Do not douche. ? Do not use tampons. ? Do not use medicines, creams, or suppositories in the vagina. ? Do not have sexual intercourse.  Ask your health care provider about: ? Changing or stopping your regular medicines. This is especially important if you are taking diabetes medicines or blood thinners. ? Taking medicines such as aspirin and ibuprofen. These medicines can thin your blood. Do not take  these medicines before your procedure if your health care provider instructs you not to. It is likely that your health care provider will tell you to avoid taking aspirin or medicine that contains aspirin for 7 days before the procedure.  Follow instructions from your health care provider about eating or drinking restrictions. You will likely need to eat a regular diet the day of the procedure and not skip any meals.  You may have an exam or testing. A pregnancy test will be taken on the day of the procedure.  You may have a blood or urine sample taken.  Plan to have someone take you home from the hospital or clinic.  If you will be going home right after the procedure, plan to have someone with you for 24 hours. What happens during the procedure?  You will lie down on your back, with your feet in foot rests (stirrups).  A warmed and lubricated instrument (speculum) will be inserted into your vagina. The speculum will be used to hold apart the walls of your vagina so your health care provider can see  your cervix and the inside of your vagina.  A cotton swab will be used to place a small amount of liquid solution on the areas to be examined. This solution makes it easier to see abnormal cells. You may feel a slight burning during this part.  The colposcope will be used to scan the cervix with a bright white light. The colposcope will be held near your vulvaand will magnify your vulva, vagina, and cervix for easier examination.  Your health care provider may decide to take a biopsy. If so: ? You may be given medicine to numb the area (local anesthetic). ? Surgical instruments will be used to suck out mucus and cells through your vagina. ? You may feel mild pain while the tissue sample is removed. ? Bleeding may occur. A solution may be used to stop the bleeding. ? If a sample of tissue is needed from the inside of the cervix, a different procedure called endocervical curettage (ECC) may be  completed. During this procedure, a curved instrument (curette) will be used to scrape cells from your cervix or the top of your cervix (endocervix).  Your health care provider will record the location of any abnormalities. The procedure may vary among health care providers and hospitals. What happens after the procedure?  You will lie down and rest for a few minutes. You may be offered juice or cookies.  Your blood pressure, heart rate, breathing rate, and blood oxygen level will be monitored until any medicines you were given have worn off.  You may have to wear compression stockings. These stockings help to prevent blood clots and reduce swelling in your legs.  You may have some cramping in your abdomen. This should go away after a few minutes. This information is not intended to replace advice given to you by your health care provider. Make sure you discuss any questions you have with your health care provider. Document Revised: 08/15/2017 Document Reviewed: 04/08/2016 Elsevier Patient Education  2020 [employer].

## 2020-08-14 NOTE — Progress Notes (Signed)
Patient given informed consent, signed copy in the chart, time out was performed. 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 Placed in lithotomy position. Cervix viewed with speculum and colposcope after application of acetic acid and lugol's solution.  Colposcopy adequate?  no Acetowhite lesions? none Punctation? none Mosaicism?  non Abnormal vasculature?  non Biopsies? non ECC? yes  COMMENTS:  Patient was given post procedure instructions.  She will have results relayed through [place] or phone call  Warden Fillers, MD

## 2020-08-16 LAB — SURGICAL PATHOLOGY

## 2020-10-10 ENCOUNTER — Other Ambulatory Visit: Payer: Self-pay

## 2020-10-10 ENCOUNTER — Emergency Department (HOSPITAL_BASED_OUTPATIENT_CLINIC_OR_DEPARTMENT_OTHER): Payer: BC Managed Care – PPO

## 2020-10-10 ENCOUNTER — Ambulatory Visit (HOSPITAL_COMMUNITY)
Admission: EM | Admit: 2020-10-10 | Discharge: 2020-10-10 | Disposition: A | Discharge status: Home / Self Care | Payer: BC Managed Care – PPO | Attending: Emergency Medicine | Admitting: Emergency Medicine

## 2020-10-10 ENCOUNTER — Emergency Department (HOSPITAL_BASED_OUTPATIENT_CLINIC_OR_DEPARTMENT_OTHER)
Admission: EM | Admit: 2020-10-10 | Discharge: 2020-10-11 | Disposition: A | Discharge status: Home / Self Care | Payer: BC Managed Care – PPO | Source: Home / Self Care | Attending: Emergency Medicine | Admitting: Emergency Medicine

## 2020-10-10 ENCOUNTER — Encounter (HOSPITAL_COMMUNITY): Payer: Self-pay | Admitting: Emergency Medicine

## 2020-10-10 ENCOUNTER — Encounter (HOSPITAL_BASED_OUTPATIENT_CLINIC_OR_DEPARTMENT_OTHER): Payer: Self-pay

## 2020-10-10 DIAGNOSIS — K529 Noninfective gastroenteritis and colitis, unspecified: Secondary | ICD-10-CM

## 2020-10-10 DIAGNOSIS — R112 Nausea with vomiting, unspecified: Secondary | ICD-10-CM

## 2020-10-10 DIAGNOSIS — K649 Unspecified hemorrhoids: Secondary | ICD-10-CM | POA: Insufficient documentation

## 2020-10-10 DIAGNOSIS — R197 Diarrhea, unspecified: Secondary | ICD-10-CM | POA: Diagnosis not present

## 2020-10-10 DIAGNOSIS — K921 Melena: Secondary | ICD-10-CM

## 2020-10-10 DIAGNOSIS — Z79899 Other long term (current) drug therapy: Secondary | ICD-10-CM | POA: Insufficient documentation

## 2020-10-10 DIAGNOSIS — K5100 Ulcerative (chronic) pancolitis without complications: Secondary | ICD-10-CM | POA: Insufficient documentation

## 2020-10-10 DIAGNOSIS — A4189 Other specified sepsis: Secondary | ICD-10-CM | POA: Diagnosis not present

## 2020-10-10 DIAGNOSIS — R1084 Generalized abdominal pain: Secondary | ICD-10-CM

## 2020-10-10 DIAGNOSIS — K625 Hemorrhage of anus and rectum: Secondary | ICD-10-CM

## 2020-10-10 DIAGNOSIS — I10 Essential (primary) hypertension: Secondary | ICD-10-CM | POA: Insufficient documentation

## 2020-10-10 LAB — COMPREHENSIVE METABOLIC PANEL
ALT: 13 U/L (ref 0–44)
AST: 16 U/L (ref 15–41)
Albumin: 3.4 g/dL — ABNORMAL LOW (ref 3.5–5.0)
Alkaline Phosphatase: 54 U/L (ref 38–126)
Anion gap: 10 (ref 5–15)
BUN: 10 mg/dL (ref 6–20)
CO2: 25 mmol/L (ref 22–32)
Calcium: 8.6 mg/dL — ABNORMAL LOW (ref 8.9–10.3)
Chloride: 98 mmol/L (ref 98–111)
Creatinine, Ser: 1.05 mg/dL — ABNORMAL HIGH (ref 0.44–1.00)
GFR, Estimated: >60 mL/min (ref >60)
Glucose, Bld: 122 mg/dL — ABNORMAL HIGH (ref 70–99)
Potassium: 3.1 mmol/L — ABNORMAL LOW (ref 3.5–5.1)
Sodium: 133 mmol/L — ABNORMAL LOW (ref 135–145)
Total Bilirubin: 0.3 mg/dL (ref 0.3–1.2)
Total Protein: 7.8 g/dL (ref 6.5–8.1)

## 2020-10-10 LAB — URINALYSIS, ROUTINE W REFLEX MICROSCOPIC
Bilirubin Urine: NEGATIVE
Glucose, UA: NEGATIVE mg/dL
Ketones, ur: NEGATIVE mg/dL
Leukocytes,Ua: NEGATIVE
Nitrite: NEGATIVE
Protein, ur: NEGATIVE mg/dL
Specific Gravity, Urine: 1.03 (ref 1.005–1.030)
pH: 6 (ref 5.0–8.0)

## 2020-10-10 LAB — CBC
HCT: 30.4 % — ABNORMAL LOW (ref 36.0–46.0)
Hemoglobin: 9.5 g/dL — ABNORMAL LOW (ref 12.0–15.0)
MCH: 21.9 pg — ABNORMAL LOW (ref 26.0–34.0)
MCHC: 31.3 g/dL (ref 30.0–36.0)
MCV: 70 fL — ABNORMAL LOW (ref 80.0–100.0)
Platelets: 410 10*3/uL — ABNORMAL HIGH (ref 150–400)
RBC: 4.34 MIL/uL (ref 3.87–5.11)
RDW: 21.9 % — ABNORMAL HIGH (ref 11.5–15.5)
WBC: 13.7 10*3/uL — ABNORMAL HIGH (ref 4.0–10.5)
nRBC: 0 % (ref 0.0–0.2)

## 2020-10-10 LAB — URINALYSIS, MICROSCOPIC (REFLEX)

## 2020-10-10 LAB — OCCULT BLOOD X 1 CARD TO LAB, STOOL: Fecal Occult Bld: POSITIVE — AB

## 2020-10-10 LAB — PREGNANCY, URINE: Preg Test, Ur: NEGATIVE

## 2020-10-10 LAB — LIPASE, BLOOD: Lipase: 23 U/L (ref 11–51)

## 2020-10-10 MED ORDER — SODIUM CHLORIDE 0.9 % IV BOLUS
1000.0000 mL | Freq: Once | INTRAVENOUS | Status: AC
  Administered 2020-10-10: 1000 mL via INTRAVENOUS

## 2020-10-10 MED ORDER — ONDANSETRON HCL 4 MG/2ML IJ SOLN
4.0000 mg | Freq: Once | INTRAMUSCULAR | Status: DC
  Filled 2020-10-10: qty 2

## 2020-10-10 MED ORDER — MORPHINE SULFATE (PF) 4 MG/ML IV SOLN
4.0000 mg | Freq: Once | INTRAVENOUS | Status: AC
  Administered 2020-10-10: 4 mg via INTRAVENOUS
  Filled 2020-10-10: qty 1

## 2020-10-10 MED ORDER — POTASSIUM CHLORIDE CRYS ER 20 MEQ PO TBCR: 40.0000 meq | EXTENDED_RELEASE_TABLET | Freq: Once | ORAL | Status: DC

## 2020-10-10 MED ORDER — ONDANSETRON 4 MG PO TBDP
4.0000 mg | ORAL_TABLET | Freq: Once | ORAL | Status: AC
  Administered 2020-10-10: 4 mg via ORAL

## 2020-10-10 MED ORDER — IOHEXOL 300 MG/ML  SOLN
100.0000 mL | Freq: Once | INTRAMUSCULAR | Status: AC | PRN
  Administered 2020-10-10: 100 mL via INTRAVENOUS

## 2020-10-10 MED ORDER — PANTOPRAZOLE SODIUM 40 MG IV SOLR
40.0000 mg | Freq: Once | INTRAVENOUS | Status: AC
  Administered 2020-10-10: 40 mg via INTRAVENOUS
  Filled 2020-10-10: qty 40

## 2020-10-10 MED ORDER — ONDANSETRON 4 MG PO TBDP
ORAL_TABLET | ORAL | Status: AC
  Filled 2020-10-10: qty 1

## 2020-10-10 MED ORDER — PROMETHAZINE HCL 25 MG/ML IJ SOLN
25.0000 mg | Freq: Once | INTRAMUSCULAR | Status: AC
  Administered 2020-10-10: 25 mg via INTRAVENOUS
  Filled 2020-10-10: qty 1

## 2020-10-10 NOTE — Discharge Instructions (Addendum)
Go to the Emergency Department for evaluation of your abdominal pain, nausea, vomiting, diarrhea.

## 2020-10-10 NOTE — ED Provider Notes (Signed)
[facility] EMERGENCY DEPARTMENT Provider Note   CSN: [id number] Arrival date & time: 10/10/20  1614     History Chief Complaint  Patient presents with  . Abdominal Pain    Eloina L Buskirk is a 45 y.o. female.  Karaline L Tuller is a 45 y.o. female with a history of hypertension, peptic ulcer disease due to H. pylori, hemorrhoids, rectal bleeding, who presents to the emergency department for 4 days of vomiting, generalized abdominal pain, blood in stool, diarrhea and rectal pain.  Patient reports in December she had an EGD and colonoscopy and was diagnosed with 2 peptic ulcers.  She was placed on 2 antibiotics, PPI and Pepto-Bismol for these ulcers was taking this medication for 10 days without issue but on Saturday started feeling poorly with persistent vomiting for the past 3 days, has been unable to keep down these medications.  Has started to have worsening generalized abdominal pain.  She has not noted any bright red blood or dark coffee-ground like emesis.  She reports abdominal pain is present throughout but most severe in the epigastric and periumbilical regions.  She reports she has been able to keep down very little over the past few days.  Also reports frequent diarrhea and she has noted some dark red blood in her stool.  She has rectal pain with defecation.  Has known history of hemorrhoids and has had previous rectal bleeding that had stopped for a while, but started bleeding again about a week after her colonoscopy.  She reports she does not think they removed or did any procedures on hemorrhoids during her colonoscopy.  Did not find any other abnormalities within the colon during this procedure.  She originally went to urgent care today, but was sent to the ED for higher level of care given her symptoms.        Past Medical History:  Diagnosis Date  . Hypertension     Patient Active Problem List   Diagnosis Date Noted  . ASCUS with positive high risk HPV  cervical 08/14/2020  . Spasm of back muscles 04/16/2017  . Routine health maintenance 07/18/2013    Past Surgical History:  Procedure Laterality Date  . WISDOM TOOTH EXTRACTION       OB History   No obstetric history on file.     Family History  Problem Relation Age of Onset  . Hypertension Mother   . Breast cancer Neg Hx     Social History   Tobacco Use  . Smoking status: Never Smoker  . Smokeless tobacco: Never Used  Substance Use Topics  . Alcohol use: Not Currently  . Drug use: Never    Home Medications Prior to Admission medications   Medication Sig Start Date End Date Taking? Authorizing Provider  Acetaminophen (TYLENOL) 325 MG CAPS Take by mouth as needed.    [provider]  amLODipine (NORVASC) 10 MG tablet  11/29/19   [provider]  ondansetron (ZOFRAN ODT) 4 MG disintegrating tablet Take 1 tablet (4 mg total) by mouth every 8 (eight) hours as needed for nausea or vomiting. 08/03/20   Milagros Loll, MD    Allergies    Hydrocodone  Review of Systems   Review of Systems  Constitutional: Negative for chills and fever.  HENT: Negative.   Respiratory: Negative for cough and shortness of breath.   Cardiovascular: Negative for chest pain.  Gastrointestinal: Positive for abdominal pain, blood in stool, diarrhea, nausea, rectal pain and vomiting.  Genitourinary: Negative  for dysuria and frequency.  Musculoskeletal: Negative for arthralgias and myalgias.  Skin: Negative for color change and rash.  Neurological: Negative for dizziness, syncope and light-headedness.  All other systems reviewed and are negative.   Physical Exam Updated Vital Signs BP (!) 165/103   Pulse (!) 127   Temp 99.5 F (37.5 C) (Oral)   Resp 14   Ht 5\' 4"  (1.626 m)   Wt 70.8 kg   SpO2 100%   BMI 26.78 kg/m   Physical Exam Vitals and nursing note reviewed. Exam conducted with a chaperone present.  Constitutional:      General: She is not in acute  distress.    Appearance: She is well-developed and well-nourished. She is ill-appearing. She is not diaphoretic.  HENT:     Head: Normocephalic and atraumatic.     Mouth/Throat:     Mouth: Oropharynx is clear and moist.  Eyes:     General:        Right eye: No discharge.        Left eye: No discharge.     Extraocular Movements: EOM normal.     Pupils: Pupils are equal, round, and reactive to light.  Cardiovascular:     Rate and Rhythm: Regular rhythm. Tachycardia present.     Pulses: Intact distal pulses.     Heart sounds: Normal heart sounds. No murmur heard. No friction rub. No gallop.   Pulmonary:     Effort: Pulmonary effort is normal. No respiratory distress.     Breath sounds: Normal breath sounds. No wheezing or rales.     Comments: Respirations equal and unlabored, patient able to speak in full sentences, lungs clear to auscultation bilaterally  Abdominal:     General: Bowel sounds are increased. There is no distension.     Palpations: Abdomen is soft. There is no mass.     Tenderness: There is abdominal tenderness.  Genitourinary:    Comments: Chaperone present during rectal exam. Multiple small external hemorrhoids noted without active bleeding.  On internal exam there are some palpable internal hemorrhoids noted as well patient has a lot of discomfort during this exam, soft brown stool palpated, very small amount of blood noted on exam, which I suspect is from hemorrhoid Musculoskeletal:        General: No deformity or edema.     Cervical back: Neck supple.  Skin:    General: Skin is warm and dry.     Capillary Refill: Capillary refill takes less than 2 seconds.  Neurological:     Mental Status: She is alert and oriented to person, place, and time.     Coordination: Coordination normal.     Comments: Speech is clear, able to follow commands Moves extremities without ataxia, coordination intact  Psychiatric:        Mood and Affect: Mood normal.        Behavior:  Behavior normal.     ED Results / Procedures / Treatments   Labs (all labs ordered are listed, but only abnormal results are displayed) Labs Reviewed  COMPREHENSIVE METABOLIC PANEL - Abnormal; Notable for the following components:      Result Value   Sodium 133 (*)    Potassium 3.1 (*)    Glucose, Bld 122 (*)    Creatinine, Ser 1.05 (*)    Calcium 8.6 (*)    Albumin 3.4 (*)    All other components within normal limits  CBC - Abnormal; Notable for the following components:  WBC 13.7 (*)    Hemoglobin 9.5 (*)    HCT 30.4 (*)    MCV 70.0 (*)    MCH 21.9 (*)    RDW 21.9 (*)    Platelets 410 (*)    All other components within normal limits  URINALYSIS, ROUTINE W REFLEX MICROSCOPIC - Abnormal; Notable for the following components:   APPearance CLOUDY (*)    Hgb urine dipstick SMALL (*)    All other components within normal limits  URINALYSIS, MICROSCOPIC (REFLEX) - Abnormal; Notable for the following components:   Bacteria, UA FEW (*)    All other components within normal limits  LIPASE, BLOOD  PREGNANCY, URINE  OCCULT BLOOD X 1 CARD TO LAB, STOOL    EKG EKG Interpretation  Date/Time:  Tuesday October 10 2020 23:54:55 EST Ventricular Rate:  105 PR Interval:    QRS Duration: 76 QT Interval:  361 QTC Calculation: 478 R Axis:   6 Text Interpretation: Sinus tachycardia Artifact No previous ECGs available Confirmed by Paula Libra ([id number]) on 10/10/2020 11:58:20 PM   Radiology CT Abdomen Pelvis W Contrast  Result Date: 10/10/2020 CLINICAL DATA:  Nausea and vomiting.  Blood in stool. EXAM: CT ABDOMEN AND PELVIS WITH CONTRAST TECHNIQUE: Multidetector CT imaging of the abdomen and pelvis was performed using the standard protocol following bolus administration of intravenous contrast. CONTRAST:  OMNIPAQUE IOHEXOL 300 MG/ML  SOLN COMPARISON:  August 03, 2020 FINDINGS: Lower chest: The lung bases are clear. The heart size is normal. Hepatobiliary: The liver is normal.  Normal gallbladder.There is no biliary ductal dilation. Pancreas: Normal contours without ductal dilatation. No peripancreatic fluid collection. Spleen: Unremarkable. Adrenals/Urinary Tract: --Adrenal glands: Unremarkable. --Right kidney/ureter: No hydronephrosis or radiopaque kidney stones. --Left kidney/ureter: No hydronephrosis or radiopaque kidney stones. --Urinary bladder: Unremarkable. Stomach/Bowel: --Stomach/Duodenum: No hiatal hernia or other gastric abnormality. Normal duodenal course and caliber. --Small bowel: Unremarkable. --Colon: There is pancolonic wall thickening. Air-fluid levels are noted in the colon. --Appendix: Normal. Vascular/Lymphatic: Atherosclerotic calcification is present within the non-aneurysmal abdominal aorta, without hemodynamically significant stenosis. --there is mild retroperitoneal adenopathy. --mesenteric adenopathy is noted. --mild pelvic and perirectal lymphadenopathy is noted. There is adenopathy a along the IMV and SMV. Reproductive: There is a fibroid uterus. Other: No ascites or free air. The abdominal wall is normal. Musculoskeletal. No acute displaced fractures. IMPRESSION: 1. Findings are consistent with pancolonic infectious or inflammatory colitis. 2. Mesenteric adenopathy cyst similar 2 but progressed since prior study in 2021. This is favored to be reactive in etiology. A 3-6 month follow-up CT would be useful to confirm resolution or stability. 3. Fibroid uterus. Aortic Atherosclerosis (ICD10-I70.0). Electronically Signed   By: Katherine Mantle M.D.   On: 10/10/2020 23:54    Procedures Procedures   Medications Ordered in ED Medications  potassium chloride SA (KLOR-CON) CR tablet 40 mEq (has no administration in time range)  sodium chloride 0.9 % bolus 1,000 mL (1,000 mLs Intravenous New Bag/Given 10/10/20 2317)  morphine 4 MG/ML injection 4 mg (4 mg Intravenous Given 10/10/20 2320)  pantoprazole (PROTONIX) injection 40 mg (40 mg Intravenous Given  10/10/20 2324)  promethazine (PHENERGAN) injection 25 mg (25 mg Intravenous Given 10/10/20 2319)  iohexol (OMNIPAQUE) 300 MG/ML solution 100 mL (100 mLs Intravenous Contrast Given 10/10/20 2337)    ED Course  I have reviewed the triage vital signs and the nursing notes.  Pertinent labs & imaging results that were available during my care of the patient were reviewed by me and considered in my  medical decision making (see chart for details).    MDM Rules/Calculators/A&P                         45 year old female presents to the ED for evaluation of 4 days of nausea, vomiting, generalized abdominal pain, diarrhea and blood in the stool.  She was recently started on treatment for H. pylori with 2 peptic ulcers found on EGD.  Had colonoscopy as well with no focal abnormalities within the colon noted.  Does have known history of hemorrhoids and has intermittently had rectal bleeding.  Has been having bleeding and rectal pain associated with diarrhea.  Reports having greater than 5 loose bowel movements daily with some intermittent dark blood.  No fevers but reports some chills.  Feeling fatigued with persistent vomiting, unable to keep anything down including her medications for her stomach ulcers.  Worsening abdominal pain.  Will check abdominal labs and Hemoccult.  Given generalized abdominal tenderness will get CT abdomen pelvis to assess for potential colitis, obstruction, perforation or other acute intra-abdominal pathology.  Will give IV fluids, Protonix, antiemetics, analgesics.  To help with symptom control.  Patient noted to be tachycardic on arrival suspect this is a combination of pain and dehydration.  Will check EKG.  Patient with low-grade temp of 99 noted as well.  I have independently ordered, reviewed and interpreted all labs and imaging: CBC: Leukocytosis of 13.7, hemoglobin stable when compared to labs from 2 months ago at 9.5, platelets of 410 likely reactive in the setting of chronic  anemia. CMP: Mild hyponatremia 133, potassium of 3.1 likely in the setting of vomiting and diarrhea, potassium replacement ordered, creatinine at baseline at 1.05, glucose 122, no other significant electrolyte derangements and normal renal function Lipase: WNL UA: Without signs of infection, no hematuria, few bacteria noted but with squamous cells, suspect contamination Preg: Negative  EKG with sinus tachycardia no other concerning changes noted.  Heart rate improving here in the ED with fluids and pain management.  CT abdomen pelvis with findings consistent with pancolonic infectious or inflammatory colitis in the setting of current antibiotic therapy for H. pylori and frequent diarrhea this certainly raises concern for possible C. difficile.  Will order C. difficile testing here in the ED.  Patient with some mesenteric adenopathy slightly progressed since study in 2021, likely reactive in the setting of infection but should be followed to ensure resolution or stability.  If patient does not have symptomatic improvement, and isn't able to tolerate PO, may require admission, care signed out to Dr. Read Drivers who will follow up on C. Diff and disposition appropriately.  Final Clinical Impression(s) / ED Diagnoses Final diagnoses:  Pancolitis (HCC)  Nausea and vomiting, intractability of vomiting not specified, unspecified vomiting type  Rectal bleeding  Hemorrhoids, unspecified hemorrhoid type    Rx / DC Orders ED Discharge Orders    None       Legrand Rams 10/11/20 0015    Molpus, Jonny Ruiz, MD 10/11/20 [phone]

## 2020-10-10 NOTE — ED Triage Notes (Signed)
Pt complains of nausea/vomiting, blood in stool. Generalized abdominal pain. States has hx of ulcers & hemorrhoids.

## 2020-10-10 NOTE — ED Triage Notes (Addendum)
Pt presents with N,V, D and bloody stools and abdominal pain. States had colonoscopsy preformed on 08/24/20 with normal results.

## 2020-10-10 NOTE — ED Provider Notes (Incomplete)
[facility] EMERGENCY DEPARTMENT Provider Note   CSN: [id number] Arrival date & time: 10/10/20  1614     History Chief Complaint  Patient presents with  . Abdominal Pain    Kara Cain is a 45 y.o. female.  Kara Cain is a 45 y.o. female with a history of hypertension, peptic ulcer disease due to H. pylori, hemorrhoids, rectal bleeding, who presents to the emergency department for 4 days of vomiting, generalized abdominal pain, blood in stool, diarrhea and rectal pain.  Patient reports in December she had an EGD and colonoscopy and was diagnosed with 2 peptic ulcers.  She was placed on 2 antibiotics, PPI and Pepto-Bismol for these ulcers was taking this medication for 10 days without issue but on Saturday started feeling poorly with persistent vomiting for the past 3 days, has been unable to keep down these medications.  Has started to have worsening generalized abdominal pain.  She has not noted any bright red blood or dark coffee-ground like emesis.  She reports abdominal pain is present throughout but most severe in the epigastric and periumbilical regions.  She reports she has been able to keep down very little over the past few days.  Also reports frequent diarrhea and she has noted some dark red blood in her stool.  She has rectal pain with defecation.  Has known history of hemorrhoids and has had previous rectal bleeding that had stopped for a while, but started bleeding again about a week after her colonoscopy.  She reports she does not think they removed or did any procedures on hemorrhoids during her colonoscopy.  Did not find any other abnormalities within the colon during this procedure.  She originally went to urgent care today, but was sent to the ED for higher level of care given her symptoms.        Past Medical History:  Diagnosis Date  . Hypertension     Patient Active Problem List   Diagnosis Date Noted  . ASCUS with positive high risk HPV  cervical 08/14/2020  . Spasm of back muscles 04/16/2017  . Routine health maintenance 07/18/2013    Past Surgical History:  Procedure Laterality Date  . WISDOM TOOTH EXTRACTION       OB History   No obstetric history on file.     Family History  Problem Relation Age of Onset  . Hypertension Mother   . Breast cancer Neg Hx     Social History   Tobacco Use  . Smoking status: Never Smoker  . Smokeless tobacco: Never Used  Substance Use Topics  . Alcohol use: Not Currently  . Drug use: Never    Home Medications Prior to Admission medications   Medication Sig Start Date End Date Taking? Authorizing Provider  Acetaminophen (TYLENOL) 325 MG CAPS Take by mouth as needed.    [provider]  amLODipine (NORVASC) 10 MG tablet  11/29/19   [provider]  ondansetron (ZOFRAN ODT) 4 MG disintegrating tablet Take 1 tablet (4 mg total) by mouth every 8 (eight) hours as needed for nausea or vomiting. 08/03/20   Milagros Loll, MD    Allergies    Hydrocodone  Review of Systems   Review of Systems  Constitutional: Negative for chills and fever.  HENT: Negative.   Respiratory: Negative for cough and shortness of breath.   Cardiovascular: Negative for chest pain.  Gastrointestinal: Positive for abdominal pain, blood in stool, diarrhea, nausea, rectal pain and vomiting.  Genitourinary: Negative  for dysuria and frequency.  Musculoskeletal: Negative for arthralgias and myalgias.  Skin: Negative for color change and rash.  Neurological: Negative for dizziness, syncope and light-headedness.  All other systems reviewed and are negative.   Physical Exam Updated Vital Signs BP (!) 165/103   Pulse (!) 127   Temp 99.5 F (37.5 C) (Oral)   Resp 14   Ht 5\' 4"  (1.626 m)   Wt 70.8 kg   SpO2 100%   BMI 26.78 kg/m   Physical Exam Vitals and nursing note reviewed. Exam conducted with a chaperone present.  Constitutional:      General: She is not in acute  distress.    Appearance: She is well-developed and well-nourished. She is ill-appearing. She is not diaphoretic.  HENT:     Head: Normocephalic and atraumatic.     Mouth/Throat:     Mouth: Oropharynx is clear and moist.  Eyes:     General:        Right eye: No discharge.        Left eye: No discharge.     Extraocular Movements: EOM normal.     Pupils: Pupils are equal, round, and reactive to light.  Cardiovascular:     Rate and Rhythm: Regular rhythm. Tachycardia present.     Pulses: Intact distal pulses.     Heart sounds: Normal heart sounds. No murmur heard. No friction rub. No gallop.   Pulmonary:     Effort: Pulmonary effort is normal. No respiratory distress.     Breath sounds: Normal breath sounds. No wheezing or rales.     Comments: Respirations equal and unlabored, patient able to speak in full sentences, lungs clear to auscultation bilaterally  Abdominal:     General: Bowel sounds are increased. There is no distension.     Palpations: Abdomen is soft. There is no mass.     Tenderness: There is abdominal tenderness.  Genitourinary:    Comments: Chaperone present during rectal exam. Multiple small external hemorrhoids noted without active bleeding.  On internal exam there are some palpable internal hemorrhoids noted as well patient has a lot of discomfort during this exam, soft brown stool palpated, very small amount of blood noted on exam, which I suspect is from hemorrhoid Musculoskeletal:        General: No deformity or edema.     Cervical back: Neck supple.  Skin:    General: Skin is warm and dry.     Capillary Refill: Capillary refill takes less than 2 seconds.  Neurological:     Mental Status: She is alert and oriented to person, place, and time.     Coordination: Coordination normal.     Comments: Speech is clear, able to follow commands Moves extremities without ataxia, coordination intact  Psychiatric:        Mood and Affect: Mood normal.        Behavior:  Behavior normal.     ED Results / Procedures / Treatments   Labs (all labs ordered are listed, but only abnormal results are displayed) Labs Reviewed  COMPREHENSIVE METABOLIC PANEL - Abnormal; Notable for the following components:      Result Value   Sodium 133 (*)    Potassium 3.1 (*)    Glucose, Bld 122 (*)    Creatinine, Ser 1.05 (*)    Calcium 8.6 (*)    Albumin 3.4 (*)    All other components within normal limits  CBC - Abnormal; Notable for the following components:  WBC 13.7 (*)    Hemoglobin 9.5 (*)    HCT 30.4 (*)    MCV 70.0 (*)    MCH 21.9 (*)    RDW 21.9 (*)    Platelets 410 (*)    All other components within normal limits  URINALYSIS, ROUTINE W REFLEX MICROSCOPIC - Abnormal; Notable for the following components:   APPearance CLOUDY (*)    Hgb urine dipstick SMALL (*)    All other components within normal limits  URINALYSIS, MICROSCOPIC (REFLEX) - Abnormal; Notable for the following components:   Bacteria, UA FEW (*)    All other components within normal limits  LIPASE, BLOOD  PREGNANCY, URINE  OCCULT BLOOD X 1 CARD TO LAB, STOOL    EKG None  Radiology No results found.  Procedures Procedures {Remember to document critical care time when appropriate:1}  Medications Ordered in ED Medications  sodium chloride 0.9 % bolus 1,000 mL (has no administration in time range)  morphine 4 MG/ML injection 4 mg (has no administration in time range)  pantoprazole (PROTONIX) injection 40 mg (has no administration in time range)  promethazine (PHENERGAN) injection 25 mg (has no administration in time range)    ED Course  I have reviewed the triage vital signs and the nursing notes.  Pertinent labs & imaging results that were available during my care of the patient were reviewed by me and considered in my medical decision making (see chart for details).    MDM Rules/Calculators/A&P                          *** Final Clinical Impression(s) / ED  Diagnoses Final diagnoses:  None    Rx / DC Orders ED Discharge Orders    None

## 2020-10-10 NOTE — ED Provider Notes (Signed)
[facility]    CSN: [id number] Arrival date & time: 10/10/20  1456      History   Chief Complaint Chief Complaint  Patient presents with  . Emesis  . Diarrhea  . Hemorrhoids  . Rectal Bleeding    HPI Kara Cain is a 45 y.o. female.   Patient presents with abdominal pain, nausea, vomiting, diarrhea, blood in stool x3 days.  She states blood is bright red and she attributes it to hemorrhoids.  2 episodes of emesis today; 2 episodes of diarrhea today.  Patient states she is unable to keep fluids or food down.  She denies fever, chills, cough, shortness of breath, or other symptoms.  She reports a recent colonoscopy; she is followed by gastroenterology for acute gastric ulcer and rectal bleeding; last seen in December 2021. She had a negative abdominal CT on 08/03/2020.  Her medical history also includes hypertension.  The history is provided by the patient and medical records.    Past Medical History:  Diagnosis Date  . Hypertension     Patient Active Problem List   Diagnosis Date Noted  . 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 Spasm of back muscles 04/16/2017  . Routine health maintenance 07/18/2013    Past Surgical History:  Procedure Laterality Date  . WISDOM TOOTH EXTRACTION      OB History   No obstetric history on file.      Home Medications    Prior to Admission medications   Medication Sig Start Date End Date Taking? Authorizing Provider  Acetaminophen (TYLENOL) 325 MG CAPS Take by mouth as needed.    [provider]  amLODipine (NORVASC) 10 MG tablet  11/29/19   [provider]  ondansetron (ZOFRAN ODT) 4 MG disintegrating tablet Take 1 tablet (4 mg total) by mouth every 8 (eight) hours as needed for nausea or vomiting. 08/03/20   Milagros Loll, MD    Family History Family History  Problem Relation Age of Onset  . Hypertension Mother   . Breast cancer Neg Hx     Social History Social  History   Tobacco Use  . Smoking status: Never Smoker  . Smokeless tobacco: Never Used  Substance Use Topics  . Alcohol use: Yes  . Drug use: Never     Allergies   Hydrocodone   Review of Systems Review of Systems  Constitutional: Negative for chills and fever.  HENT: Negative for ear pain and sore throat.   Eyes: Negative for pain and visual disturbance.  Respiratory: Negative for cough and shortness of breath.   Cardiovascular: Negative for chest pain and palpitations.  Gastrointestinal: Positive for abdominal pain, blood in stool, diarrhea, nausea and vomiting.  Genitourinary: Negative for dysuria and hematuria.  Musculoskeletal: Negative for arthralgias and back pain.  Skin: Negative for color change and rash.  Neurological: Negative for seizures and syncope.  All other systems reviewed and are negative.    Physical Exam Triage Vital Signs ED Triage Vitals  Enc Vitals Group     BP 10/10/20 1517 (!) 157/98     Pulse Rate 10/10/20 1517 (!) 132     Resp 10/10/20 1517 18     Temp 10/10/20 1517 100.2 F (37.9 C)     Temp Source 10/10/20 1517 Oral     SpO2 10/10/20 1517 100 %     Weight --      Height --      Head Circumference --  Peak Flow --      Pain Score 10/10/20 1516 9     Pain Loc --      Pain Edu? --      Excl. in GC? --    No data found.  Updated Vital Signs BP (!) 157/98 (BP Location: Right Arm)   Pulse (!) 132   Temp 100.2 F (37.9 C) (Oral)   Resp 18   SpO2 100%   Visual Acuity Right Eye Distance:   Left Eye Distance:   Bilateral Distance:    Right Eye Near:   Left Eye Near:    Bilateral Near:     Physical Exam Vitals and nursing note reviewed.  Constitutional:      General: She is not in acute distress.    Appearance: She is well-developed and well-nourished. She is ill-appearing.  HENT:     Head: Normocephalic and atraumatic.  Eyes:     Conjunctiva/sclera: Conjunctivae normal.  Cardiovascular:     Rate and Rhythm: Normal  rate and regular rhythm.     Heart sounds: No murmur heard.   Pulmonary:     Effort: Pulmonary effort is normal. No respiratory distress.     Breath sounds: Normal breath sounds.  Abdominal:     General: Bowel sounds are normal.     Palpations: Abdomen is soft.     Tenderness: There is generalized abdominal tenderness. There is no guarding or rebound.     Comments: Abdomen acutely tender to palpation.  Musculoskeletal:        General: No edema.     Cervical back: Neck supple.  Skin:    General: Skin is warm and dry.  Neurological:     General: No focal deficit present.     Mental Status: She is alert and oriented to person, place, and time.     Gait: Gait normal.  Psychiatric:        Mood and Affect: Mood and affect and mood normal.        Behavior: Behavior normal.      UC Treatments / Results  Labs (all labs ordered are listed, but only abnormal results are displayed) Labs Reviewed - No data to display  EKG   Radiology No results found.  Procedures Procedures (including critical care time)  Medications Ordered in UC Medications  ondansetron (ZOFRAN-ODT) disintegrating tablet 4 mg (4 mg Oral Given 10/10/20 1543)    Initial Impression / Assessment and Plan / UC Course  I have reviewed the triage vital signs and the nursing notes.  Pertinent labs & imaging results that were available during my care of the patient were reviewed by me and considered in my medical decision making (see chart for details).   Abdominal pain, Nausea, Vomiting, Diarrhea, Bloody stool.  Patient's abdomen is acutely tender to palpation.  Sending her to the ED for evaluation.   Final Clinical Impressions(s) / UC Diagnoses   Final diagnoses:  Generalized abdominal pain  Nausea vomiting and diarrhea  Bloody stool     Discharge Instructions     Go to the Emergency Department for evaluation of your abdominal pain, nausea, vomiting, diarrhea.          ED Prescriptions    None      PDMP not reviewed this encounter.   Mickie Bail, NP 10/10/20 [phone]

## 2020-10-10 NOTE — ED Notes (Signed)
Patient is being discharged from the Urgent Care and sent to the Emergency Department via POV . Per Leanna Battles, NP, patient is in need of higher level of care due to acute abdominal pain and dehyrdation. Patient is aware and verbalizes understanding of plan of care.  Vitals:   10/10/20 1517  BP: (!) 157/98  Pulse: (!) 132  Resp: 18  Temp: 100.2 F (37.9 C)  SpO2: 100%

## 2020-10-10 NOTE — ED Notes (Signed)
Pt not in the room at the moment to do a EKG. Will return.

## 2020-10-11 LAB — C DIFFICILE QUICK SCREEN W PCR REFLEX
C Diff antigen: POSITIVE — AB
C Diff toxin: NEGATIVE

## 2020-10-11 LAB — CLOSTRIDIUM DIFFICILE BY PCR, REFLEXED: Toxigenic C. Difficile by PCR: NEGATIVE

## 2020-10-11 MED ORDER — MORPHINE SULFATE (PF) 4 MG/ML IV SOLN
4.0000 mg | INTRAVENOUS | Status: DC | PRN
  Administered 2020-10-11: 4 mg via INTRAVENOUS
  Filled 2020-10-11: qty 1

## 2020-10-11 MED ORDER — CIPROFLOXACIN HCL 500 MG PO TABS: 500.0000 mg | ORAL_TABLET | Freq: Two times a day (BID) | ORAL | 0 refills | Status: DC

## 2020-10-11 MED ORDER — OXYCODONE-ACETAMINOPHEN 5-325 MG PO TABS: 1.0000 | ORAL_TABLET | Freq: Four times a day (QID) | ORAL | 0 refills | Status: DC | PRN

## 2020-10-11 MED ORDER — METRONIDAZOLE 500 MG PO TABS: 500.0000 mg | ORAL_TABLET | Freq: Three times a day (TID) | ORAL | 0 refills | Status: DC

## 2020-10-11 NOTE — ED Notes (Signed)
She is alert and oriented x 4 with clear speech. She ambulates without difficulty and tells [place] she feels "much better-I got a good nap."

## 2020-10-11 NOTE — ED Provider Notes (Addendum)
Nursing notes and vitals signs, including pulse oximetry, reviewed.  Summary of this visit's results, reviewed by myself:  EKG:  EKG Interpretation  Date/Time:  Tuesday October 10 2020 23:54:55 EST Ventricular Rate:  105 PR Interval:    QRS Duration: 76 QT Interval:  361 QTC Calculation: 478 R Axis:   6 Text Interpretation: Sinus tachycardia Artifact No previous ECGs available Confirmed by Christiann Hagerty, Jonny Ruiz ([id number]) on 10/10/2020 11:58:20 PM       Labs:  Results for orders placed or performed during the hospital encounter of 10/10/20 (from the past 24 hour(s))  Lipase, blood     Status: None   Collection Time: 10/10/20  5:04 PM  Result Value Ref Range   Lipase 23 11 - 51 U/L  Comprehensive metabolic panel     Status: Abnormal   Collection Time: 10/10/20  5:04 PM  Result Value Ref Range   Sodium 133 (L) 135 - 145 mmol/L   Potassium 3.1 (L) 3.5 - 5.1 mmol/L   Chloride 98 98 - 111 mmol/L   CO2 25 22 - 32 mmol/L   Glucose, Bld 122 (H) 70 - 99 mg/dL   BUN 10 6 - 20 mg/dL   Creatinine, Ser 0.16 (H) 0.44 - 1.00 mg/dL   Calcium 8.6 (L) 8.9 - 10.3 mg/dL   Total Protein 7.8 6.5 - 8.1 g/dL   Albumin 3.4 (L) 3.5 - 5.0 g/dL   AST 16 15 - 41 U/L   ALT 13 0 - 44 U/L   Alkaline Phosphatase 54 38 - 126 U/L   Total Bilirubin 0.3 0.3 - 1.2 mg/dL   GFR, Estimated >01 >09 mL/min   Anion gap 10 5 - 15  CBC     Status: Abnormal   Collection Time: 10/10/20  5:04 PM  Result Value Ref Range   WBC 13.7 (H) 4.0 - 10.5 K/uL   RBC 4.34 3.87 - 5.11 MIL/uL   Hemoglobin 9.5 (L) 12.0 - 15.0 g/dL   HCT 32.3 (L) 55.7 - 32.2 %   MCV 70.0 (L) 80.0 - 100.0 fL   MCH 21.9 (L) 26.0 - 34.0 pg   MCHC 31.3 30.0 - 36.0 g/dL   RDW 02.5 (H) 42.7 - 06.2 %   Platelets 410 (H) 150 - 400 K/uL   nRBC 0.0 0.0 - 0.2 %  Urinalysis, Routine w reflex microscopic Urine, Clean Catch     Status: Abnormal   Collection Time: 10/10/20  5:04 PM  Result Value Ref Range   Color, Urine YELLOW YELLOW   APPearance CLOUDY (A) CLEAR    Specific Gravity, Urine 1.030 1.005 - 1.030   pH 6.0 5.0 - 8.0   Glucose, UA NEGATIVE NEGATIVE mg/dL   Hgb urine dipstick SMALL (A) NEGATIVE   Bilirubin Urine NEGATIVE NEGATIVE   Ketones, ur NEGATIVE NEGATIVE mg/dL   Protein, ur NEGATIVE NEGATIVE mg/dL   Nitrite NEGATIVE NEGATIVE   Leukocytes,Ua NEGATIVE NEGATIVE  Pregnancy, urine     Status: None   Collection Time: 10/10/20  5:04 PM  Result Value Ref Range   Preg Test, Ur NEGATIVE NEGATIVE  Urinalysis, Microscopic (reflex)     Status: Abnormal   Collection Time: 10/10/20  5:04 PM  Result Value Ref Range   RBC / HPF 0-5 0 - 5 RBC/hpf   WBC, UA 0-5 0 - 5 WBC/hpf   Bacteria, UA FEW (A) NONE SEEN   Squamous Epithelial / LPF 11-20 0 - 5  Occult blood card to lab, stool  Status: Abnormal   Collection Time: 10/10/20 10:50 PM  Result Value Ref Range   Fecal Occult Bld POSITIVE (A) NEGATIVE    Imaging Studies: CT Abdomen Pelvis W Contrast  Result Date: 10/10/2020 CLINICAL DATA:  Nausea and vomiting.  Blood in stool. EXAM: CT ABDOMEN AND PELVIS WITH CONTRAST TECHNIQUE: Multidetector CT imaging of the abdomen and pelvis was performed using the standard protocol following bolus administration of intravenous contrast. CONTRAST:  OMNIPAQUE IOHEXOL 300 MG/ML  SOLN COMPARISON:  August 03, 2020 FINDINGS: Lower chest: The lung bases are clear. The heart size is normal. Hepatobiliary: The liver is normal. Normal gallbladder.There is no biliary ductal dilation. Pancreas: Normal contours without ductal dilatation. No peripancreatic fluid collection. Spleen: Unremarkable. Adrenals/Urinary Tract: --Adrenal glands: Unremarkable. --Right kidney/ureter: No hydronephrosis or radiopaque kidney stones. --Left kidney/ureter: No hydronephrosis or radiopaque kidney stones. --Urinary bladder: Unremarkable. Stomach/Bowel: --Stomach/Duodenum: No hiatal hernia or other gastric abnormality. Normal duodenal course and caliber. --Small bowel: Unremarkable.  --Colon: There is pancolonic wall thickening. Air-fluid levels are noted in the colon. --Appendix: Normal. Vascular/Lymphatic: Atherosclerotic calcification is present within the non-aneurysmal abdominal aorta, without hemodynamically significant stenosis. --there is mild retroperitoneal adenopathy. --mesenteric adenopathy is noted. --mild pelvic and perirectal lymphadenopathy is noted. There is adenopathy a along the IMV and SMV. Reproductive: There is a fibroid uterus. Other: No ascites or free air. The abdominal wall is normal. Musculoskeletal. No acute displaced fractures. IMPRESSION: 1. Findings are consistent with pancolonic infectious or inflammatory colitis. 2. Mesenteric adenopathy cyst similar 2 but progressed since prior study in 2021. This is favored to be reactive in etiology. A 3-6 month follow-up CT would be useful to confirm resolution or stability. 3. Fibroid uterus. Aortic Atherosclerosis (ICD10-I70.0). Electronically Signed   By: Katherine Mantle M.D.   On: 10/10/2020 23:54   3:50 AM Patient's pain has improved but is still present.  She would like additional pain medication.  We are awaiting patient's C. difficile test.  6:53 AM C. difficile by PCR is negative.  Patient would prefer a home trial on antibiotics.  She was advised the differential diagnosis includes infectious colitis and inflammatory Bowel disease.  If she worsens or does not improve she was advised to return.  She has a PCP with whom she can follow-up.     Zephan Beauchaine, Jonny Ruiz, MD 10/11/20 [id number]    Paula Libra, MD 10/11/20 [phone]

## 2020-10-12 ENCOUNTER — Inpatient Hospital Stay (HOSPITAL_BASED_OUTPATIENT_CLINIC_OR_DEPARTMENT_OTHER)
Admission: EM | Admit: 2020-10-12 | Discharge: 2020-10-21 | DRG: 872 | Disposition: A | Discharge status: Home Health | Payer: BC Managed Care – PPO | Attending: Family Medicine | Admitting: Family Medicine

## 2020-10-12 ENCOUNTER — Encounter (HOSPITAL_BASED_OUTPATIENT_CLINIC_OR_DEPARTMENT_OTHER): Payer: Self-pay | Admitting: *Deleted

## 2020-10-12 ENCOUNTER — Emergency Department (HOSPITAL_BASED_OUTPATIENT_CLINIC_OR_DEPARTMENT_OTHER): Payer: BC Managed Care – PPO

## 2020-10-12 ENCOUNTER — Other Ambulatory Visit: Payer: Self-pay

## 2020-10-12 DIAGNOSIS — K5100 Ulcerative (chronic) pancolitis without complications: Secondary | ICD-10-CM

## 2020-10-12 DIAGNOSIS — E876 Hypokalemia: Secondary | ICD-10-CM | POA: Diagnosis present

## 2020-10-12 DIAGNOSIS — R000 Tachycardia, unspecified: Secondary | ICD-10-CM | POA: Diagnosis present

## 2020-10-12 DIAGNOSIS — Y92239 Unspecified place in hospital as the place of occurrence of the external cause: Secondary | ICD-10-CM | POA: Diagnosis not present

## 2020-10-12 DIAGNOSIS — A4189 Other specified sepsis: Principal | ICD-10-CM | POA: Diagnosis present

## 2020-10-12 DIAGNOSIS — E871 Hypo-osmolality and hyponatremia: Secondary | ICD-10-CM | POA: Diagnosis present

## 2020-10-12 DIAGNOSIS — A0472 Enterocolitis due to Clostridium difficile, not specified as recurrent: Secondary | ICD-10-CM

## 2020-10-12 DIAGNOSIS — I700 Atherosclerosis of aorta: Secondary | ICD-10-CM

## 2020-10-12 DIAGNOSIS — D649 Anemia, unspecified: Secondary | ICD-10-CM | POA: Diagnosis present

## 2020-10-12 DIAGNOSIS — Z888 Allergy status to other drugs, medicaments and biological substances status: Secondary | ICD-10-CM

## 2020-10-12 DIAGNOSIS — K648 Other hemorrhoids: Secondary | ICD-10-CM | POA: Diagnosis present

## 2020-10-12 DIAGNOSIS — T50995A Adverse effect of other drugs, medicaments and biological substances, initial encounter: Secondary | ICD-10-CM | POA: Diagnosis not present

## 2020-10-12 DIAGNOSIS — A419 Sepsis, unspecified organism: Secondary | ICD-10-CM

## 2020-10-12 DIAGNOSIS — Z8249 Family history of ischemic heart disease and other diseases of the circulatory system: Secondary | ICD-10-CM

## 2020-10-12 DIAGNOSIS — E860 Dehydration: Secondary | ICD-10-CM | POA: Diagnosis present

## 2020-10-12 DIAGNOSIS — D509 Iron deficiency anemia, unspecified: Secondary | ICD-10-CM

## 2020-10-12 DIAGNOSIS — I10 Essential (primary) hypertension: Secondary | ICD-10-CM | POA: Diagnosis present

## 2020-10-12 DIAGNOSIS — Z20822 Contact with and (suspected) exposure to covid-19: Secondary | ICD-10-CM | POA: Diagnosis present

## 2020-10-12 DIAGNOSIS — Z885 Allergy status to narcotic agent status: Secondary | ICD-10-CM

## 2020-10-12 DIAGNOSIS — Z79899 Other long term (current) drug therapy: Secondary | ICD-10-CM

## 2020-10-12 DIAGNOSIS — R590 Localized enlarged lymph nodes: Secondary | ICD-10-CM | POA: Diagnosis present

## 2020-10-12 DIAGNOSIS — Z8719 Personal history of other diseases of the digestive system: Secondary | ICD-10-CM

## 2020-10-12 LAB — URINALYSIS, ROUTINE W REFLEX MICROSCOPIC
Bilirubin Urine: NEGATIVE
Glucose, UA: NEGATIVE mg/dL
Ketones, ur: NEGATIVE mg/dL
Leukocytes,Ua: NEGATIVE
Nitrite: NEGATIVE
Protein, ur: NEGATIVE mg/dL
Specific Gravity, Urine: 1.01 (ref 1.005–1.030)
pH: 5 (ref 5.0–8.0)

## 2020-10-12 LAB — LACTIC ACID, PLASMA
Lactic Acid, Venous: 2 mmol/L (ref 0.5–1.9)
Lactic Acid, Venous: 2.1 mmol/L (ref 0.5–1.9)

## 2020-10-12 LAB — CBC WITH DIFFERENTIAL/PLATELET
Abs Immature Granulocytes: 0.25 10*3/uL — ABNORMAL HIGH (ref 0.00–0.07)
Basophils Absolute: 0.1 10*3/uL (ref 0.0–0.1)
Basophils Relative: 0 %
Eosinophils Absolute: 0 10*3/uL (ref 0.0–0.5)
Eosinophils Relative: 0 %
HCT: 27.9 % — ABNORMAL LOW (ref 36.0–46.0)
Hemoglobin: 8.9 g/dL — ABNORMAL LOW (ref 12.0–15.0)
Immature Granulocytes: 1 %
Lymphocytes Relative: 6 %
Lymphs Abs: 1.1 10*3/uL (ref 0.7–4.0)
MCH: 21.5 pg — ABNORMAL LOW (ref 26.0–34.0)
MCHC: 31.9 g/dL (ref 30.0–36.0)
MCV: 67.4 fL — ABNORMAL LOW (ref 80.0–100.0)
Monocytes Absolute: 1.4 10*3/uL — ABNORMAL HIGH (ref 0.1–1.0)
Monocytes Relative: 7 %
Neutro Abs: 17.8 10*3/uL — ABNORMAL HIGH (ref 1.7–7.7)
Neutrophils Relative %: 86 %
Platelets: 422 10*3/uL — ABNORMAL HIGH (ref 150–400)
RBC: 4.14 MIL/uL (ref 3.87–5.11)
RDW: 21.5 % — ABNORMAL HIGH (ref 11.5–15.5)
Smear Review: NORMAL
WBC Morphology: INCREASED
WBC: 20.6 10*3/uL — ABNORMAL HIGH (ref 4.0–10.5)
nRBC: 0.1 % (ref 0.0–0.2)

## 2020-10-12 LAB — COMPREHENSIVE METABOLIC PANEL
ALT: 16 U/L (ref 0–44)
AST: 18 U/L (ref 15–41)
Albumin: 2.9 g/dL — ABNORMAL LOW (ref 3.5–5.0)
Alkaline Phosphatase: 50 U/L (ref 38–126)
Anion gap: 12 (ref 5–15)
BUN: 8 mg/dL (ref 6–20)
CO2: 23 mmol/L (ref 22–32)
Calcium: 8.1 mg/dL — ABNORMAL LOW (ref 8.9–10.3)
Chloride: 92 mmol/L — ABNORMAL LOW (ref 98–111)
Creatinine, Ser: 1.11 mg/dL — ABNORMAL HIGH (ref 0.44–1.00)
GFR, Estimated: >60 mL/min (ref >60)
Glucose, Bld: 188 mg/dL — ABNORMAL HIGH (ref 70–99)
Potassium: 3.3 mmol/L — ABNORMAL LOW (ref 3.5–5.1)
Sodium: 127 mmol/L — ABNORMAL LOW (ref 135–145)
Total Bilirubin: 0.7 mg/dL (ref 0.3–1.2)
Total Protein: 7.2 g/dL (ref 6.5–8.1)

## 2020-10-12 LAB — SARS CORONAVIRUS 2 BY RT PCR (HOSPITAL ORDER, PERFORMED IN ~~LOC~~ HOSPITAL LAB): SARS Coronavirus 2: NEGATIVE

## 2020-10-12 LAB — URINALYSIS, MICROSCOPIC (REFLEX): WBC, UA: NONE SEEN WBC/hpf (ref 0–5)

## 2020-10-12 LAB — APTT: aPTT: 29 seconds (ref 24–36)

## 2020-10-12 LAB — LIPASE, BLOOD: Lipase: 18 U/L (ref 11–51)

## 2020-10-12 LAB — PREGNANCY, URINE: Preg Test, Ur: NEGATIVE

## 2020-10-12 LAB — PROTIME-INR
INR: 1.6 — ABNORMAL HIGH (ref 0.8–1.2)
Prothrombin Time: 18.6 seconds — ABNORMAL HIGH (ref 11.4–15.2)

## 2020-10-12 MED ORDER — SODIUM CHLORIDE 0.9 % IV SOLN
2.0000 g | Freq: Once | INTRAVENOUS | Status: AC
  Administered 2020-10-12: 2 g via INTRAVENOUS
  Filled 2020-10-12: qty 20

## 2020-10-12 MED ORDER — SODIUM CHLORIDE 0.9 % IV BOLUS (SEPSIS)
1000.0000 mL | Freq: Once | INTRAVENOUS | Status: AC
  Administered 2020-10-12: 1000 mL via INTRAVENOUS

## 2020-10-12 MED ORDER — SODIUM CHLORIDE 0.9 % IV BOLUS (SEPSIS): 250.0000 mL | Freq: Once | INTRAVENOUS | Status: DC

## 2020-10-12 MED ORDER — ONDANSETRON HCL 4 MG/2ML IJ SOLN
4.0000 mg | Freq: Once | INTRAMUSCULAR | Status: AC
  Administered 2020-10-12: 4 mg via INTRAVENOUS
  Filled 2020-10-12: qty 2

## 2020-10-12 MED ORDER — SODIUM CHLORIDE 0.9 % IV BOLUS
1000.0000 mL | Freq: Once | INTRAVENOUS | Status: AC
  Administered 2020-10-12: 1000 mL via INTRAVENOUS

## 2020-10-12 MED ORDER — ACETAMINOPHEN 325 MG PO TABS: 650.0000 mg | ORAL_TABLET | Freq: Once | ORAL | Status: AC

## 2020-10-12 MED ORDER — ACETAMINOPHEN 325 MG PO TABS
ORAL_TABLET | ORAL | Status: AC
  Administered 2020-10-12: 650 mg via ORAL
  Filled 2020-10-12: qty 2

## 2020-10-12 MED ORDER — LACTATED RINGERS IV SOLN: INTRAVENOUS | Status: AC

## 2020-10-12 MED ORDER — METRONIDAZOLE IN NACL 5-0.79 MG/ML-% IV SOLN
500.0000 mg | Freq: Once | INTRAVENOUS | Status: AC
  Administered 2020-10-12: 500 mg via INTRAVENOUS
  Filled 2020-10-12: qty 100

## 2020-10-12 MED ORDER — FENTANYL CITRATE (PF) 100 MCG/2ML IJ SOLN
100.0000 ug | Freq: Once | INTRAMUSCULAR | Status: AC
  Administered 2020-10-12: 100 ug via INTRAVENOUS
  Filled 2020-10-12: qty 2

## 2020-10-12 NOTE — Sepsis Progress Note (Signed)
Sepsis monitoring complete 

## 2020-10-12 NOTE — ED Triage Notes (Addendum)
C/o cont n/v x 6 days , seen here Tuesday for same and  refused admission.

## 2020-10-12 NOTE — Progress Notes (Signed)
elink monitoring code sepsis.  

## 2020-10-12 NOTE — Progress Notes (Signed)
54 y oF presented with nausea, vomiting, diarrhea. Febrile, tachycardic. CT scan abdomen with pancolitis Refused admission on 1/25, was discharged with Cipro and Flagyl  Return with worsening symptoms White count 20,000, lactic acid 2.1. CDiff antigen positive but PCR negative.  Admission for sepsis secondary to colitis Currently on IV Rocephin and IV Flagyl. Fluid bolus given.  Blood culture sent.  GI pathogen panel sent. Maintenance fluid LR at 150 mill per hour.  Accepted at progressive/stepdown bed.

## 2020-10-12 NOTE — ED Notes (Signed)
Presents with nausea and fever, feeling fatigued, states was in our ED this past Tuesday with similar complaints placed on cont cardiac monitoring , 12 lead ECG obtained, freq VS settings at q66min. PO tylenol given. IVF bolus initiated as per orders.

## 2020-10-12 NOTE — ED Notes (Signed)
EDP informed of HR remaining at ST of 130's, will cont to monitor and observe, no new orders rec at this time. Awaiting room assignment

## 2020-10-12 NOTE — ED Notes (Signed)
STOOL SPEC TO LAB

## 2020-10-12 NOTE — Progress Notes (Signed)
MD notified of need for order for 3rd LA since 2nd LA went up from 2.0 to 2.1.  Awaiting response.  Signing off to Gloris Ham, RN.

## 2020-10-12 NOTE — ED Provider Notes (Signed)
Received pt at signout from Dr Dalene Seltzer.  45 yo female here with pancolitis, now given IV rocephin, IV flagyl, and 30 cc/kg IV sepsis fluid bolus.  HR improved.  BP stable.  Plan for medical admission.    PT had CT as recent as yesterday - did not feel emergent repeat was necessary.  No clinical signs of perforation.   450 pm - sign out given to hospitalist, admission for pancolitis/sepsis - continue IVF, recheck lactate, will need repeat labs in AM if she boards here overnight.    1150 pm- [facility] here for transport.  Pt's heart rate 120's, BP stable.  She reports some improvement of abdominal pain.  No rigidity or guarding on exam.  Still low suspicion overall for perforation.  I've asked nursing to resend a lactate and CBC now, but stable for transport.   Terald Sleeper, MD 10/12/20 [phone]

## 2020-10-12 NOTE — ED Notes (Signed)
LACTIC ACID LEVELS OBTAINED

## 2020-10-12 NOTE — ED Notes (Signed)
CBC AND LACTIC ACID REDRAWN AND TAKEN TO THE LAB

## 2020-10-12 NOTE — ED Provider Notes (Signed)
[facility] EMERGENCY DEPARTMENT Provider Note   CSN: [id number] Arrival date & time: 10/12/20  1305     History Chief Complaint  Patient presents with  . Emesis    Kara Cain is a 45 y.o. female.  HPI      45yo female with history of hypertension, PUD, ED visit yesterday diagnosed with pancolitis on CT and placed on cipro/flagyl with negative CDiff PCR, presents with concern for continued nausea, vomiting and abdominal pain.  Was recently on antibiotics for PUD/h. Pylori. CDiff antigen positive but PCR negative.    Despite home therapies is continuing to have severe nausea, vomiting and diarrhea.  Reports continuing abdominal pain which is not changed since last night. Constant burning pain, severe, diffuse.  Having 5 loose stools daily. Has not been able to keep medications down.  Had fevers developing today. Has had chills.   Past Medical History:  Diagnosis Date  . Hypertension     Patient Active Problem List   Diagnosis Date Noted  . Sepsis (HCC) 10/12/2020  . 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 Spasm of back muscles 04/16/2017  . Routine health maintenance 07/18/2013    Past Surgical History:  Procedure Laterality Date  . WISDOM TOOTH EXTRACTION       OB History   No obstetric history on file.     Family History  Problem Relation Age of Onset  . Hypertension Mother   . Breast cancer Neg Hx     Social History   Tobacco Use  . Smoking status: Never Smoker  . Smokeless tobacco: Never Used  Substance Use Topics  . Alcohol use: Not Currently  . Drug use: Never    Home Medications Prior to Admission medications   Medication Sig Start Date End Date Taking? Authorizing Provider  Acetaminophen (TYLENOL) 325 MG CAPS Take by mouth as needed.    [provider]  amLODipine (NORVASC) 10 MG tablet  11/29/19   [provider]  ciprofloxacin (CIPRO) 500 MG tablet Take 1 tablet (500 mg total) by mouth 2  (two) times daily. One po bid x 7 days 10/11/20   Molpus, John, MD  metroNIDAZOLE (FLAGYL) 500 MG tablet Take 1 tablet (500 mg total) by mouth 3 (three) times daily. One po bid x 7 days 10/11/20   Molpus, Jonny Ruiz, MD  ondansetron (ZOFRAN ODT) 4 MG disintegrating tablet Take 1 tablet (4 mg total) by mouth every 8 (eight) hours as needed for nausea or vomiting. 08/03/20   Milagros Loll, MD  oxyCODONE-acetaminophen (PERCOCET) 5-325 MG tablet Take 1 tablet by mouth every 6 (six) hours as needed for severe pain. 10/11/20   Molpus, John, MD    Allergies    Hydrocodone  Review of Systems   Review of Systems  Constitutional: Positive for appetite change, chills, fatigue and fever.  HENT: Negative for sore throat.   Respiratory: Negative for cough and shortness of breath.   Cardiovascular: Negative for chest pain.  Gastrointestinal: Positive for abdominal pain, diarrhea, nausea and vomiting.  Genitourinary: Negative for difficulty urinating.  Musculoskeletal: Negative for back pain.  Neurological: Positive for light-headedness. Negative for syncope and headaches.    Physical Exam Updated Vital Signs BP 140/82 (BP Location: Right Arm)   Pulse (!) 120   Temp 98.8 F (37.1 C) (Oral)   Resp (!) 28   Ht 5\' 4"  (1.626 m)   Wt 68 kg   SpO2 97%   BMI 25.75 kg/m  Physical Exam Vitals and nursing note reviewed.  Constitutional:      General: She is not in acute distress.    Appearance: Normal appearance. She is not ill-appearing, toxic-appearing or diaphoretic.  HENT:     Head: Normocephalic.  Eyes:     Conjunctiva/sclera: Conjunctivae normal.  Cardiovascular:     Rate and Rhythm: Normal rate and regular rhythm.     Pulses: Normal pulses.  Pulmonary:     Effort: Pulmonary effort is normal. No respiratory distress.  Abdominal:     Tenderness: There is abdominal tenderness (diffuse).  Musculoskeletal:        General: No deformity or signs of injury.     Cervical back: No rigidity.   Skin:    General: Skin is warm and dry.     Coloration: Skin is not jaundiced or pale.  Neurological:     General: No focal deficit present.     Mental Status: She is alert and oriented to person, place, and time.     ED Results / Procedures / Treatments   Labs (all labs ordered are listed, but only abnormal results are displayed) Labs Reviewed  LACTIC ACID, PLASMA - Abnormal; Notable for the following components:      Result Value   Lactic Acid, Venous 2.0 (*)    All other components within normal limits  LACTIC ACID, PLASMA - Abnormal; Notable for the following components:   Lactic Acid, Venous 2.1 (*)    All other components within normal limits  CBC WITH DIFFERENTIAL/PLATELET - Abnormal; Notable for the following components:   WBC 20.6 (*)    Hemoglobin 8.9 (*)    HCT 27.9 (*)    MCV 67.4 (*)    MCH 21.5 (*)    RDW 21.5 (*)    Platelets 422 (*)    Neutro Abs 17.8 (*)    Monocytes Absolute 1.4 (*)    Abs Immature Granulocytes 0.25 (*)    All other components within normal limits  COMPREHENSIVE METABOLIC PANEL - Abnormal; Notable for the following components:   Sodium 127 (*)    Potassium 3.3 (*)    Chloride 92 (*)    Glucose, Bld 188 (*)    Creatinine, Ser 1.11 (*)    Calcium 8.1 (*)    Albumin 2.9 (*)    All other components within normal limits  PROTIME-INR - Abnormal; Notable for the following components:   Prothrombin Time 18.6 (*)    INR 1.6 (*)    All other components within normal limits  URINALYSIS, ROUTINE W REFLEX MICROSCOPIC - Abnormal; Notable for the following components:   Color, Urine STRAW (*)    APPearance CLOUDY (*)    Hgb urine dipstick SMALL (*)    All other components within normal limits  URINALYSIS, MICROSCOPIC (REFLEX) - Abnormal; Notable for the following components:   Bacteria, UA FEW (*)    All other components within normal limits  SARS CORONAVIRUS 2 BY RT PCR (HOSPITAL ORDER, PERFORMED IN [facility])  CULTURE, BLOOD  (ROUTINE X 2)  CULTURE, BLOOD (ROUTINE X 2)  URINE CULTURE  GASTROINTESTINAL PANEL BY PCR, STOOL (REPLACES STOOL CULTURE)  LIPASE, BLOOD  APTT  PREGNANCY, URINE    EKG None  Radiology CT Abdomen Pelvis W Contrast  Result Date: 10/10/2020 CLINICAL DATA:  Nausea and vomiting.  Blood in stool. EXAM: CT ABDOMEN AND PELVIS WITH CONTRAST TECHNIQUE: Multidetector CT imaging of the abdomen and pelvis was performed using the standard protocol following  bolus administration of intravenous contrast. CONTRAST:  OMNIPAQUE IOHEXOL 300 MG/ML  SOLN COMPARISON:  August 03, 2020 FINDINGS: Lower chest: The lung bases are clear. The heart size is normal. Hepatobiliary: The liver is normal. Normal gallbladder.There is no biliary ductal dilation. Pancreas: Normal contours without ductal dilatation. No peripancreatic fluid collection. Spleen: Unremarkable. Adrenals/Urinary Tract: --Adrenal glands: Unremarkable. --Right kidney/ureter: No hydronephrosis or radiopaque kidney stones. --Left kidney/ureter: No hydronephrosis or radiopaque kidney stones. --Urinary bladder: Unremarkable. Stomach/Bowel: --Stomach/Duodenum: No hiatal hernia or other gastric abnormality. Normal duodenal course and caliber. --Small bowel: Unremarkable. --Colon: There is pancolonic wall thickening. Air-fluid levels are noted in the colon. --Appendix: Normal. Vascular/Lymphatic: Atherosclerotic calcification is present within the non-aneurysmal abdominal aorta, without hemodynamically significant stenosis. --there is mild retroperitoneal adenopathy. --mesenteric adenopathy is noted. --mild pelvic and perirectal lymphadenopathy is noted. There is adenopathy a along the IMV and SMV. Reproductive: There is a fibroid uterus. Other: No ascites or free air. The abdominal wall is normal. Musculoskeletal. No acute displaced fractures. IMPRESSION: 1. Findings are consistent with pancolonic infectious or inflammatory colitis. 2. Mesenteric adenopathy cyst  similar 2 but progressed since prior study in 2021. This is favored to be reactive in etiology. A 3-6 month follow-up CT would be useful to confirm resolution or stability. 3. Fibroid uterus. Aortic Atherosclerosis (ICD10-I70.0). Electronically Signed   By: Katherine Mantle M.D.   On: 10/10/2020 23:54   DG Chest Port 1 View  Result Date: 10/12/2020 CLINICAL DATA:  Fatigue, nausea, blood in stool, questionable sepsis EXAM: PORTABLE CHEST 1 VIEW COMPARISON:  Portable exam 1412 hours without priors for comparison FINDINGS: Normal heart size, mediastinal contours, and pulmonary vascularity. Lungs clear. No infiltrate, pleural effusion, or pneumothorax. Osseous structures unremarkable. IMPRESSION: No acute abnormalities. Electronically Signed   By: Ulyses Southward M.D.   On: 10/12/2020 14:31    Procedures .Critical Care Performed by: Alvira Monday, MD Authorized by: Alvira Monday, MD   Critical care provider statement:    Critical care time (minutes):  45   Critical care was time spent personally by me on the following activities:  Evaluation of patient's response to treatment, examination of patient, ordering and performing treatments and interventions, ordering and review of laboratory studies, ordering and review of radiographic studies, pulse oximetry, obtaining history from patient or surrogate and review of old charts     Medications Ordered in ED Medications  lactated ringers infusion (has no administration in time range)  sodium chloride 0.9 % bolus 1,000 mL (0 mLs Intravenous Stopped 10/12/20 1456)    And  sodium chloride 0.9 % bolus 1,000 mL (0 mLs Intravenous Stopped 10/12/20 1613)    And  sodium chloride 0.9 % bolus 250 mL (has no administration in time range)  acetaminophen (TYLENOL) tablet 650 mg (650 mg Oral Given 10/12/20 1329)  sodium chloride 0.9 % bolus 1,000 mL (0 mLs Intravenous Stopped 10/12/20 1457)  cefTRIAXone (ROCEPHIN) 2 g in sodium chloride 0.9 % 100 mL IVPB (0 g  Intravenous Stopped 10/12/20 1542)  metroNIDAZOLE (FLAGYL) IVPB 500 mg (0 mg Intravenous Stopped 10/12/20 1613)  ondansetron (ZOFRAN) injection 4 mg (4 mg Intravenous Given 10/12/20 1503)  fentaNYL (SUBLIMAZE) injection 100 mcg (100 mcg Intravenous Given 10/12/20 1503)    ED Course  I have reviewed the triage vital signs and the nursing notes.  Pertinent labs & imaging results that were available during my care of the patient were reviewed by me and considered in my medical decision making (see chart for details).  MDM Rules/Calculators/A&P                          45yo female with history of hypertension, PUD, ED visit yesterday diagnosed with pancolitis on CT and placed on cipro/flagyl with negative CDiff PCR, presents with concern for continued nausea, vomiting and abdominal pain.  Presenting with fever, tachycardia with HR in 160s.  Diffuse mild tenderness, soft abdomen, no acute change in pain since CT yesterday and doubt acute perforation or other complications.  Tachycardia may be secondary to dehydration, fever and/or sepsis. Ordered blood cx, empiric abx for intraabdominal infection (CDiff PCR neg yesterday), 30 cc/kg NS.  Labs show hyponatremia, leukocytosis.  COVID testing negative. Plan admission for sepsis/tachycardia/dehydration colitis failing outpt therapy.    Final Clinical Impression(s) / ED Diagnoses Final diagnoses:  Pancolitis (HCC)  Sepsis, due to unspecified organism, unspecified whether acute organ dysfunction present [facility])    Rx / DC Orders ED Discharge Orders    None       Alvira Monday, MD 10/12/20 2245

## 2020-10-12 NOTE — ED Notes (Signed)
Resting quietly, appears to be sleeping comfortably at this time. Open eyes to light verbal stimuli, cont to await for room assignment at [facility]. Will cont to monitor and observe

## 2020-10-13 ENCOUNTER — Encounter (HOSPITAL_COMMUNITY): Payer: Self-pay | Admitting: Internal Medicine

## 2020-10-13 DIAGNOSIS — R000 Tachycardia, unspecified: Secondary | ICD-10-CM | POA: Diagnosis present

## 2020-10-13 DIAGNOSIS — K648 Other hemorrhoids: Secondary | ICD-10-CM | POA: Diagnosis present

## 2020-10-13 DIAGNOSIS — Y92239 Unspecified place in hospital as the place of occurrence of the external cause: Secondary | ICD-10-CM | POA: Diagnosis not present

## 2020-10-13 DIAGNOSIS — E860 Dehydration: Secondary | ICD-10-CM | POA: Diagnosis present

## 2020-10-13 DIAGNOSIS — A419 Sepsis, unspecified organism: Secondary | ICD-10-CM

## 2020-10-13 DIAGNOSIS — Z20822 Contact with and (suspected) exposure to covid-19: Secondary | ICD-10-CM | POA: Diagnosis present

## 2020-10-13 DIAGNOSIS — Z888 Allergy status to other drugs, medicaments and biological substances status: Secondary | ICD-10-CM | POA: Diagnosis not present

## 2020-10-13 DIAGNOSIS — D649 Anemia, unspecified: Secondary | ICD-10-CM | POA: Diagnosis present

## 2020-10-13 DIAGNOSIS — T50995A Adverse effect of other drugs, medicaments and biological substances, initial encounter: Secondary | ICD-10-CM | POA: Diagnosis not present

## 2020-10-13 DIAGNOSIS — Z8719 Personal history of other diseases of the digestive system: Secondary | ICD-10-CM | POA: Diagnosis not present

## 2020-10-13 DIAGNOSIS — R590 Localized enlarged lymph nodes: Secondary | ICD-10-CM | POA: Diagnosis present

## 2020-10-13 DIAGNOSIS — K5100 Ulcerative (chronic) pancolitis without complications: Secondary | ICD-10-CM | POA: Diagnosis present

## 2020-10-13 DIAGNOSIS — A4189 Other specified sepsis: Secondary | ICD-10-CM | POA: Diagnosis present

## 2020-10-13 DIAGNOSIS — E871 Hypo-osmolality and hyponatremia: Secondary | ICD-10-CM | POA: Diagnosis present

## 2020-10-13 DIAGNOSIS — D509 Iron deficiency anemia, unspecified: Secondary | ICD-10-CM | POA: Diagnosis present

## 2020-10-13 DIAGNOSIS — A0472 Enterocolitis due to Clostridium difficile, not specified as recurrent: Secondary | ICD-10-CM | POA: Diagnosis present

## 2020-10-13 DIAGNOSIS — I700 Atherosclerosis of aorta: Secondary | ICD-10-CM | POA: Diagnosis present

## 2020-10-13 DIAGNOSIS — I10 Essential (primary) hypertension: Secondary | ICD-10-CM | POA: Diagnosis present

## 2020-10-13 DIAGNOSIS — Z79899 Other long term (current) drug therapy: Secondary | ICD-10-CM | POA: Diagnosis not present

## 2020-10-13 DIAGNOSIS — Z8249 Family history of ischemic heart disease and other diseases of the circulatory system: Secondary | ICD-10-CM | POA: Diagnosis not present

## 2020-10-13 DIAGNOSIS — Z885 Allergy status to narcotic agent status: Secondary | ICD-10-CM | POA: Diagnosis not present

## 2020-10-13 DIAGNOSIS — E876 Hypokalemia: Secondary | ICD-10-CM | POA: Diagnosis present

## 2020-10-13 LAB — GASTROINTESTINAL PANEL BY PCR, STOOL (REPLACES STOOL CULTURE)

## 2020-10-13 LAB — LACTIC ACID, PLASMA
Lactic Acid, Venous: 1.1 mmol/L (ref 0.5–1.9)
Lactic Acid, Venous: 1.2 mmol/L (ref 0.5–1.9)
Lactic Acid, Venous: 1.3 mmol/L (ref 0.5–1.9)
Lactic Acid, Venous: 1.3 mmol/L (ref 0.5–1.9)

## 2020-10-13 LAB — CBC
HCT: 23.7 % — ABNORMAL LOW (ref 36.0–46.0)
Hemoglobin: 7.8 g/dL — ABNORMAL LOW (ref 12.0–15.0)
MCH: 22.2 pg — ABNORMAL LOW (ref 26.0–34.0)
MCHC: 32.9 g/dL (ref 30.0–36.0)
MCV: 67.5 fL — ABNORMAL LOW (ref 80.0–100.0)
Platelets: 225 10*3/uL (ref 150–400)
RBC: 3.51 MIL/uL — ABNORMAL LOW (ref 3.87–5.11)
RDW: 21.2 % — ABNORMAL HIGH (ref 11.5–15.5)
WBC: 16.4 10*3/uL — ABNORMAL HIGH (ref 4.0–10.5)
nRBC: 0.1 % (ref 0.0–0.2)

## 2020-10-13 LAB — IRON AND TIBC
Iron: 8 ug/dL — ABNORMAL LOW (ref 28–170)
Saturation Ratios: 3 % — ABNORMAL LOW (ref 10.4–31.8)
TIBC: 241 ug/dL — ABNORMAL LOW (ref 250–450)
UIBC: 233 ug/dL

## 2020-10-13 LAB — COMPREHENSIVE METABOLIC PANEL
ALT: 15 U/L (ref 0–44)
AST: 12 U/L — ABNORMAL LOW (ref 15–41)
Albumin: 2.5 g/dL — ABNORMAL LOW (ref 3.5–5.0)
Alkaline Phosphatase: 51 U/L (ref 38–126)
Anion gap: 12 (ref 5–15)
BUN: 7 mg/dL (ref 6–20)
CO2: 24 mmol/L (ref 22–32)
Calcium: 8.1 mg/dL — ABNORMAL LOW (ref 8.9–10.3)
Chloride: 100 mmol/L (ref 98–111)
Creatinine, Ser: 0.5 mg/dL (ref 0.44–1.00)
GFR, Estimated: >60 mL/min (ref >60)
Glucose, Bld: 138 mg/dL — ABNORMAL HIGH (ref 70–99)
Potassium: 3.3 mmol/L — ABNORMAL LOW (ref 3.5–5.1)
Sodium: 136 mmol/L (ref 135–145)
Total Bilirubin: 0.5 mg/dL (ref 0.3–1.2)
Total Protein: 6.3 g/dL — ABNORMAL LOW (ref 6.5–8.1)

## 2020-10-13 LAB — RETICULOCYTES
Immature Retic Fract: 10.6 % (ref 2.3–15.9)
RBC.: 3.43 MIL/uL — ABNORMAL LOW (ref 3.87–5.11)
Retic Count, Absolute: 25.4 10*3/uL (ref 19.0–186.0)
Retic Ct Pct: 0.7 % (ref 0.4–3.1)

## 2020-10-13 LAB — URINE CULTURE: Culture: NO GROWTH

## 2020-10-13 LAB — FERRITIN: Ferritin: 66 ng/mL (ref 11–307)

## 2020-10-13 LAB — TYPE AND SCREEN
ABO/RH(D): B POS
Antibody Screen: NEGATIVE

## 2020-10-13 LAB — HIV ANTIBODY (ROUTINE TESTING W REFLEX): HIV Screen 4th Generation wRfx: NONREACTIVE

## 2020-10-13 LAB — MRSA PCR SCREENING: MRSA by PCR: NEGATIVE

## 2020-10-13 LAB — PROCALCITONIN: Procalcitonin: 7.77 ng/mL

## 2020-10-13 LAB — APTT: aPTT: 30 seconds (ref 24–36)

## 2020-10-13 LAB — FOLATE: Folate: 11.9 ng/mL (ref >5.9)

## 2020-10-13 LAB — PROTIME-INR
INR: 1.4 — ABNORMAL HIGH (ref 0.8–1.2)
Prothrombin Time: 16.7 seconds — ABNORMAL HIGH (ref 11.4–15.2)

## 2020-10-13 LAB — VITAMIN B12: Vitamin B-12: 1516 pg/mL — ABNORMAL HIGH (ref 180–914)

## 2020-10-13 MED ORDER — AMLODIPINE BESYLATE 10 MG PO TABS
10.0000 mg | ORAL_TABLET | Freq: Every day | ORAL | Status: DC
Start: 2020-10-13 — End: 2020-10-17
  Administered 2020-10-13 – 2020-10-16 (×4): 10 mg via ORAL
  Filled 2020-10-13 (×5): qty 1

## 2020-10-13 MED ORDER — METOPROLOL SUCCINATE ER 25 MG PO TB24
12.5000 mg | ORAL_TABLET | Freq: Every day | ORAL | Status: DC
  Administered 2020-10-13 – 2020-10-14 (×2): 12.5 mg via ORAL
  Filled 2020-10-13 (×2): qty 1

## 2020-10-13 MED ORDER — CHLORHEXIDINE GLUCONATE CLOTH 2 % EX PADS
6.0000 | MEDICATED_PAD | Freq: Every day | CUTANEOUS | Status: DC
  Administered 2020-10-13 – 2020-10-15 (×2): 6 via TOPICAL

## 2020-10-13 MED ORDER — METRONIDAZOLE IN NACL 5-0.79 MG/ML-% IV SOLN: 500.0000 mg | Freq: Three times a day (TID) | INTRAVENOUS | Status: DC

## 2020-10-13 MED ORDER — HEPARIN SODIUM (PORCINE) 5000 UNIT/ML IJ SOLN: 5000.0000 [IU] | Freq: Three times a day (TID) | INTRAMUSCULAR | Status: DC

## 2020-10-13 MED ORDER — OXYCODONE-ACETAMINOPHEN 7.5-325 MG PO TABS
1.0000 | ORAL_TABLET | ORAL | Status: DC | PRN
  Administered 2020-10-13 – 2020-10-16 (×11): 1 via ORAL
  Filled 2020-10-13 (×13): qty 1

## 2020-10-13 MED ORDER — POTASSIUM CHLORIDE CRYS ER 20 MEQ PO TBCR
40.0000 meq | EXTENDED_RELEASE_TABLET | Freq: Every day | ORAL | Status: DC
  Administered 2020-10-13: 40 meq via ORAL
  Filled 2020-10-13: qty 2

## 2020-10-13 MED ORDER — ORAL CARE MOUTH RINSE
15.0000 mL | Freq: Two times a day (BID) | OROMUCOSAL | Status: DC
  Administered 2020-10-13 – 2020-10-21 (×11): 15 mL via OROMUCOSAL

## 2020-10-13 MED ORDER — SODIUM CHLORIDE 0.9 % IV SOLN: 2.0000 g | INTRAVENOUS | Status: DC

## 2020-10-13 MED ORDER — MORPHINE SULFATE (PF) 2 MG/ML IV SOLN
1.0000 mg | INTRAVENOUS | Status: DC | PRN
  Administered 2020-10-13 (×2): 1 mg via INTRAVENOUS
  Filled 2020-10-13 (×2): qty 1

## 2020-10-13 MED ORDER — ENOXAPARIN SODIUM 40 MG/0.4ML ~~LOC~~ SOLN
40.0000 mg | SUBCUTANEOUS | Status: DC
  Administered 2020-10-13 – 2020-10-16 (×4): 40 mg via SUBCUTANEOUS
  Filled 2020-10-13 (×5): qty 0.4

## 2020-10-13 MED ORDER — VANCOMYCIN 50 MG/ML ORAL SOLUTION
125.0000 mg | Freq: Four times a day (QID) | ORAL | Status: DC
  Administered 2020-10-13 – 2020-10-21 (×33): 125 mg via ORAL
  Filled 2020-10-13 (×38): qty 2.5

## 2020-10-13 MED ORDER — METOPROLOL TARTRATE 5 MG/5ML IV SOLN: 5.0000 mg | Freq: Four times a day (QID) | INTRAVENOUS | Status: DC | PRN

## 2020-10-13 NOTE — Progress Notes (Signed)
PROGRESS NOTE   Kara Cain  XTG:[id number] DOB: 01/02/1976 DOA: 10/12/2020 PCP: Department, [facility]  Brief Narrative:  45 year old black female known history iron deficiency anemia internal hemorrhoids?  Seen at [facility] by Dr. Clifton James LE gastroenterology-had endoscopy and colonoscopy apparently 08/2020-?  Gastric ulcer Seen in the emergency room recently with nausea vomiting persistent abdominal pain 1/25 with 5 bowel movements daily-given at emergency room Protonix fluids antiemetics found to have leukocytosis at the time CT abdomen pelvis = pain colitis with mesenteric adenopathy-patient preferred home trial of antibiotics Represented 1/27 similar symptoms found to be pretty tachycardic given IV fluid boluses started on empiric Cipro Flagyl IV and started because of positive C. difficile antigen on p.o. vancomycin   Assessment & Plan:   Principal Problem:   Sepsis (HCC) Active Problems:   Pancolitis (HCC)   Essential hypertension   Anemia   1. Infectious colitis with possible C. difficile superimposed a. Antibiotic coverage deferred to only vancomycin 125 4 times daily-White count already improving b. Monitor trends of diarrhea and hopeful for improvement c. Allow diet and graduated as tolerated d. Continue saline at 150 cc/h given high amounts of stool 2. Hypokalemia a. Replacing with p.o. potassium-recheck in the morning 3. Hyponatremia a. Likely secondary to volume losses and dehydration-continue LR 150 cc/h b. Recheck in a.m. 4. Iron deficiency anemia with possible dilutional component a. Exacerbated by possible colitis b. Monitor trends transfuse if below 7 c. Follow iron studies ordered 5. HTN a. Takes amlodipine 10 at home which we will resume and monitor   DVT prophylaxis: Lovenox full Code Status: Full Family Communication: None Disposition:  Status is: Inpatient  Remains inpatient appropriate because:Hemodynamically unstable, Persistent  severe electrolyte disturbances and Altered mental status   Dispo: The patient is from: Home              Anticipated d/c is to: Home              Anticipated d/c date is: 2 days              Patient currently is not medically stable to d/c.   Difficult to place patient No       Consultants:   None  Procedures: No  Antimicrobials: Vancomycin   Subjective: Very sleepy uncomfortable in stomach no chest pain no fever no chills 2 stools today No nausea no vomiting.today   Objective: Vitals:   10/13/20 0340 10/13/20 0400 10/13/20 0500 10/13/20 0600  BP:  (!) 168/97 (!) 159/88   Pulse:  (!) 125 (!) 111 (!) 114  Resp:  20 11 17   Temp: 99 F (37.2 C)     TempSrc: Axillary     SpO2:  100% 100% 100%  Weight:      Height:        Intake/Output Summary (Last 24 hours) at 10/13/2020 10/15/2020 Last data filed at 10/13/2020 0617 Gross per 24 hour  Intake 1563.08 ml  Output --  Net 1563.08 ml   Filed Weights   10/12/20 1318 10/13/20 0047  Weight: 68 kg 67.3 kg    Examination:  EOMI NCAT no focal deficit Chest clear no added sound rales rhonchi Tender in right lower quadrant No lower extremity edema Neurologically intact Power 5/5  Data Reviewed: I have personally reviewed following labs and imaging studies White count down from 20.6-->16.4 Hemoglobin 8.9-->7.8  COVID-19 Labs  No results for input(s): DDIMER, FERRITIN, LDH, CRP in the last 72 hours.  Lab Results  Component  Value Date   SARSCOV2NAA NEGATIVE 10/12/2020   SARSCOV2NAA NOT DETECTED 07/30/2019     Radiology Studies: [facility] Chest Port 1 View  Result Date: 10/12/2020 CLINICAL DATA:  Fatigue, nausea, blood in stool, questionable sepsis EXAM: PORTABLE CHEST 1 VIEW COMPARISON:  Portable exam 1412 hours without priors for comparison FINDINGS: Normal heart size, mediastinal contours, and pulmonary vascularity. Lungs clear. No infiltrate, pleural effusion, or pneumothorax. Osseous structures unremarkable.  IMPRESSION: No acute abnormalities. Electronically Signed   By: Ulyses Southward M.D.   On: 10/12/2020 14:31     Scheduled Meds: . Chlorhexidine Gluconate Cloth  6 each Topical Daily  . mouth rinse  15 mL Mouth Rinse BID  . vancomycin  125 mg Oral QID   Continuous Infusions: . lactated ringers 150 mL/hr at 10/13/20 0617     LOS: 0 days    Time spent: 46  Rhetta Mura, MD [facility] To contact the attending provider between 7A-7P or the covering provider during after hours 7P-7A, please log into the web site [email or web address] and access using universal [place] password for that web site. If you do not have the password, please call the hospital operator.  10/13/2020, 7:22 AM

## 2020-10-13 NOTE — H&P (Signed)
History and Physical    Kara Cain KGM:[id number] DOB: 01-22-1976 DOA: 10/12/2020  PCP: Department, [facility]  Patient coming from: Home.  Chief Complaint: Nausea vomiting and diarrhea.  HPI: Kara Cain is a 45 y.o. female with history of hypertension and iron deficiency anemia who had underwent EGD and colonoscopy in December 2021 at [facility] reports of which are not accessible at this time by me presents to the ER for the second time in the last 5 days with complaints of persistent nausea vomiting diarrhea abdominal discomfort.  Patient had come on 10/10/2020 with similar complaints and at that time CT scan showed pancolitis and mesenteric adenopathy.  Patient was discharged home on antibiotics but patient was unable to take it because of persistent discomfort and vomiting and diarrhea.  Patient diarrhea was at times blood-tinged.  Denies taking any antibiotics prior to coming to the ER.  But states he she did take some antibiotics during the colonoscopy last month.  ED Course: In the ER patient was tachycardic with labs showing leukocytosis elevated lactic acid levels patient had blood cultures drawn and started on antibiotics and fluids admitted for further management of possible orthopedic sepsis secondary to colitis.  Covid test was negative.  Review of Systems: As per HPI, rest all negative.   Past Medical History:  Diagnosis Date  . Hypertension     Past Surgical History:  Procedure Laterality Date  . WISDOM TOOTH EXTRACTION       reports that she has never smoked. She has never used smokeless tobacco. She reports previous alcohol use. She reports that she does not use drugs.  Allergies  Allergen Reactions  . Hydrocodone Anxiety    Family History  Problem Relation Age of Onset  . Hypertension Mother   . Breast cancer Neg Hx     Prior to Admission medications   Medication Sig Start Date End Date Taking? Authorizing Provider   ciprofloxacin (CIPRO) 500 MG tablet Take 1 tablet (500 mg total) by mouth 2 (two) times daily. One po bid x 7 days 10/11/20  Yes Molpus, John, MD  oxyCODONE-acetaminophen (PERCOCET) 5-325 MG tablet Take 1 tablet by mouth every 6 (six) hours as needed for severe pain. 10/11/20  Yes Molpus, John, MD  metroNIDAZOLE (FLAGYL) 500 MG tablet Take 1 tablet (500 mg total) by mouth 3 (three) times daily. One po bid x 7 days 10/11/20   Molpus, Jonny Ruiz, MD    Physical Exam: Constitutional: Moderately built and nourished. Vitals:   10/13/20 0047 10/13/20 0200 10/13/20 0215 10/13/20 0340  BP:  (!) 155/93 139/80   Pulse:  (!) 110 (!) 116   Resp:  16 12   Temp:    99 F (37.2 C)  TempSrc: (S) Oral   Axillary  SpO2: 100% 99% 99%   Weight: 67.3 kg     Height: 5\' 4"  (1.626 m)      Eyes: Anicteric no pallor. ENMT: No discharge from the ears eyes nose or mouth. Neck: No mass felt.  No neck rigidity. Respiratory: No rhonchi or crepitations. Cardiovascular: S1-S2 heard. Abdomen: Soft nontender bowel sounds present. Musculoskeletal: No edema. Skin: No rash. Neurologic: Alert awake oriented to time place and person.  Moves all extremities. Psychiatric: Appears normal.  Normal affect.   Labs on Admission: I have personally reviewed following labs and imaging studies  CBC: Recent Labs  Lab 10/10/20 1704 10/12/20 1337 10/13/20 0002  WBC 13.7* 20.6* 16.4*  NEUTROABS  --  17.8*  --  HGB 9.5* 8.9* 7.8*  HCT 30.4* 27.9* 23.7*  MCV 70.0* 67.4* 67.5*  PLT 410* 422* 225   Basic Metabolic Panel: Recent Labs  Lab 10/10/20 1704 10/12/20 1337  NA 133* 127*  K 3.1* 3.3*  CL 98 92*  CO2 25 23  GLUCOSE 122* 188*  BUN 10 8  CREATININE 1.05* 1.11*  CALCIUM 8.6* 8.1*   GFR: Estimated Creatinine Clearance: 60.3 mL/min (A) (by C-G formula based on SCr of 1.11 mg/dL (H)). Liver Function Tests: Recent Labs  Lab 10/10/20 1704 10/12/20 1337  AST 16 18  ALT 13 16  ALKPHOS 54 50  BILITOT 0.3 0.7  PROT  7.8 7.2  ALBUMIN 3.4* 2.9*   Recent Labs  Lab 10/10/20 1704 10/12/20 1337  LIPASE 23 18   No results for input(s): AMMONIA in the last 168 hours. Coagulation Profile: Recent Labs  Lab 10/12/20 1500  INR 1.6*   Cardiac Enzymes: No results for input(s): CKTOTAL, CKMB, CKMBINDEX, TROPONINI in the last 168 hours. BNP (last 3 results) No results for input(s): PROBNP in the last 8760 hours. HbA1C: No results for input(s): HGBA1C in the last 72 hours. CBG: No results for input(s): GLUCAP in the last 168 hours. Lipid Profile: No results for input(s): CHOL, HDL, LDLCALC, TRIG, CHOLHDL, LDLDIRECT in the last 72 hours. Thyroid Function Tests: No results for input(s): TSH, T4TOTAL, FREET4, T3FREE, THYROIDAB in the last 72 hours. Anemia Panel: No results for input(s): VITAMINB12, FOLATE, FERRITIN, TIBC, IRON, RETICCTPCT in the last 72 hours. Urine analysis:    Component Value Date/Time   COLORURINE STRAW (A) 10/12/2020 1516   APPEARANCEUR CLOUDY (A) 10/12/2020 1516   LABSPEC 1.010 10/12/2020 1516   PHURINE 5.0 10/12/2020 1516   GLUCOSEU NEGATIVE 10/12/2020 1516   HGBUR SMALL (A) 10/12/2020 1516   BILIRUBINUR NEGATIVE 10/12/2020 1516   KETONESUR NEGATIVE 10/12/2020 1516   PROTEINUR NEGATIVE 10/12/2020 1516   UROBILINOGEN 0.2 07/30/2019 0951   NITRITE NEGATIVE 10/12/2020 1516   LEUKOCYTESUR NEGATIVE 10/12/2020 1516   Sepsis Labs: @LABRCNTIP (procalcitonin:4,lacticidven:4) ) Recent Results (from the past 240 hour(s))  C Difficile Quick Screen w PCR reflex     Status: Abnormal   Collection Time: 10/10/20 12:35 AM   Specimen: STOOL  Result Value Ref Range Status   C Diff antigen POSITIVE (A) NEGATIVE Final   C Diff toxin NEGATIVE NEGATIVE Final   C Diff interpretation Results are indeterminate. See PCR results.  Final    Comment: Performed at [facility], [address] [place], [place]  C. Diff by PCR, Reflexed     Status: None   Collection Time: 10/10/20  12:35 AM  Result Value Ref Range Status   Toxigenic C. Difficile by PCR NEGATIVE NEGATIVE Final    Comment: Patient is colonized with non toxigenic C. difficile. May not need treatment unless significant symptoms are present. Performed at [facility], [address] [place], [place]   SARS Coronavirus 2 by RT PCR (hospital order, performed in [facility] hospital lab) Nasopharyngeal Nasopharyngeal Swab     Status: None   Collection Time: 10/12/20  2:10 PM   Specimen: Nasopharyngeal Swab  Result Value Ref Range Status   SARS Coronavirus 2 NEGATIVE NEGATIVE Final    Comment: (NOTE) SARS-CoV-2 target nucleic acids are NOT DETECTED.  The SARS-CoV-2 RNA is generally detectable in upper and lower respiratory specimens during the acute phase of infection. The lowest concentration of SARS-CoV-2 viral copies this assay can detect is 250  copies / mL. A negative result does not preclude SARS-CoV-2 infection and should not be used as the sole basis for treatment or other patient management decisions.  A negative result may occur with improper specimen collection / handling, submission of specimen other than nasopharyngeal swab, presence of viral mutation(s) within the areas targeted by this assay, and inadequate number of viral copies (<250 copies / mL). A negative result must be combined with clinical observations, patient history, and epidemiological information.  Fact Sheet for Patients:   BoilerBrush.com.cy  Fact Sheet for Healthcare Providers: https://pope.com/  This test is not yet approved or  cleared by the [place] FDA and has been authorized for detection and/or diagnosis of SARS-CoV-2 by FDA under an Emergency Use Authorization (EUA).  This EUA will remain in effect (meaning this test can be used) for the duration of the COVID-19 declaration under Section 564(b)(1) of the Act, 21 U.S.C. section 360bbb-3(b)(1),  unless the authorization is terminated or revoked sooner.  Performed at [facility], [address] [place], [place] [address]   Blood Culture (routine x 2)     Status: None (Preliminary result)   Collection Time: 10/12/20  3:00 PM   Specimen: BLOOD  Result Value Ref Range Status   Specimen Description   Final    BLOOD LEFT ANTECUBITAL Performed at [facility], [address] [place], [place] [address]    Special Requests   Final    BOTTLES DRAWN AEROBIC AND ANAEROBIC Blood Culture adequate volume Performed at [facility], [address] [place], [place] [address]    Culture PENDING  Incomplete   Report Status PENDING  Incomplete  MRSA PCR Screening     Status: None   Collection Time: 10/13/20 12:37 AM   Specimen: Nasal Mucosa; Nasopharyngeal  Result Value Ref Range Status   MRSA by PCR NEGATIVE NEGATIVE Final    Comment:        The GeneXpert MRSA Assay (FDA approved for NASAL specimens only), is one component of a comprehensive MRSA colonization surveillance program. It is not intended to diagnose MRSA infection nor to guide or monitor treatment for MRSA infections. Performed at [facility], [address] [place], [place] [address]      Radiological Exams on Admission: DG Chest Port 1 View  Result Date: 10/12/2020 CLINICAL DATA:  Fatigue, nausea, blood in stool, questionable sepsis EXAM: PORTABLE CHEST 1 VIEW COMPARISON:  Portable exam 1412 hours without priors for comparison FINDINGS: Normal heart size, mediastinal contours, and pulmonary vascularity. Lungs clear. No infiltrate, pleural effusion, or pneumothorax. Osseous structures unremarkable. IMPRESSION: No acute abnormalities. Electronically Signed   By: Ulyses Southward M.D.   On: 10/12/2020 14:31     Assessment/Plan Principal Problem:   Sepsis (HCC) Active Problems:   Pancolitis (HCC)   Essential hypertension   Anemia    1. Likely from pancolitis cause  not clear but patient's C. difficile antigen was positive but toxin was negative.  Discussed with pharmacist at this time since patient has signs of pancolitis with diarrhea pharmacy is planning to dose p.o. vancomycin for possible C. difficile.  Follow cultures continue aggressive IV hydration. 2. Anemia with diarrhea and blood loss.  Has had EGD and colonoscopy last month at [facility] need to check the results.  Follow CBC closely. 3. Hypertension we will keep patient on as needed IV hydralazine for now.  Patient takes amlodipine at home.  Since patient has septic picture  on presentation will need inpatient status.   DVT prophylaxis: SCDs for now.  Avoiding anticoagulation since patient has blood-tinged diarrhea. Code Status: Full code. Family Communication: Discussed with patient. Disposition Plan: Home. Consults called: None. Admission status: Inpatient.   Eduard Clos MD [facility] Pager [phone].  If 7PM-7AM, please contact night-coverage [email or web address] Password [facility]  10/13/2020, 5:38 AM

## 2020-10-13 NOTE — TOC Initial Note (Signed)
Transition of Care [facility]) - Initial/Assessment Note    Patient Details  Name: Kara Cain MRN: [id number] Date of Birth: 05/29/1976  Transition of Care [facility]) CM/SW Contact:    Golda Acre, RN Phone Number: 10/13/2020, 8:45 AM  Clinical Narrative:                 45 y.o. female with history of hypertension and iron deficiency anemia who had underwent EGD and colonoscopy in December 2021 at [facility] reports of which are not accessible at this time by me presents to the ER for the second time in the last 5 days with complaints of persistent nausea vomiting diarrhea abdominal discomfort.  Patient had come on 10/10/2020 with similar complaints and at that time CT scan showed pancolitis and mesenteric adenopathy.  Patient was discharged home on antibiotics but patient was unable to take it because of persistent discomfort and vomiting and diarrhea.  Patient diarrhea was at times blood-tinged.  Denies taking any antibiotics prior to coming to the ER.  But states he she did take some antibiotics during the colonoscopy last month.  ED Course: In the ER patient was tachycardic with labs showing leukocytosis elevated lactic acid levels patient had blood cultures drawn and started on antibiotics and fluids admitted for further management of possible orthopedic sepsis secondary to colitis.  Covid test was negative. PLAN: to return to home lives alone but does have family near by. Following for progression:Tempo this am 102.9, po vancomycin, iv lr at 150cc/hr,, WBC-16.4, hgb 7.8  Expected Discharge Plan: Home/Self Care Barriers to Discharge: Continued Medical Work up   Patient Goals and CMS Choice Patient states their goals for this hospitalization and ongoing recovery are:: to go home CMS Medicare.gov Compare Post Acute Care list provided to:: Patient    Expected Discharge Plan and Services Expected Discharge Plan: Home/Self Care   Discharge Planning Services: CM Consult    Living arrangements for the past 2 months: Single Family Home                                      Prior Living Arrangements/Services Living arrangements for the past 2 months: Single Family Home Lives with:: Self Patient language and need for interpreter reviewed:: Yes Do you feel safe going back to the place where you live?: Yes      Need for Family Participation in Patient Care: Yes (Comment) (mother) Care giver support system in place?: Yes (comment) (mother)   Criminal Activity/Legal Involvement Pertinent to Current Situation/Hospitalization: No - Comment as needed  Activities of Daily Living      Permission Sought/Granted                  Emotional Assessment Appearance:: Appears stated age Attitude/Demeanor/Rapport: Engaged Affect (typically observed): Calm Orientation: : Oriented to Place,Oriented to Self,Oriented to  Time,Oriented to Situation Alcohol / Substance Use: Not Applicable Psych Involvement: No (comment)  Admission diagnosis:  Pancolitis (HCC) [K51.00] Sepsis (HCC) [A41.9] Sepsis, due to unspecified organism, unspecified whether acute organ dysfunction present [facility]) [A41.9] Patient Active Problem List   Diagnosis Date Noted  . Pancolitis (HCC) 10/13/2020  . Essential hypertension 10/13/2020  . Anemia 10/13/2020  . Sepsis (HCC) 10/12/2020  . 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 Spasm of back muscles 04/16/2017  . Routine health maintenance 07/18/2013   PCP:  Department, [facility] Pharmacy:  [facility] [phone] - [place], [place] - [address] AT [facility] [address] [place] [address] Phone: [phone] Fax: [phone]     Social Determinants of Health (SDOH) Interventions    Readmission Risk Interventions No flowsheet data found.

## 2020-10-13 NOTE — ED Notes (Signed)
[facility] TEAM AT BEDSIDE 

## 2020-10-13 NOTE — Plan of Care (Signed)

## 2020-10-14 LAB — COMPREHENSIVE METABOLIC PANEL
ALT: 13 U/L (ref 0–44)
AST: 11 U/L — ABNORMAL LOW (ref 15–41)
Albumin: 2.7 g/dL — ABNORMAL LOW (ref 3.5–5.0)
Alkaline Phosphatase: 57 U/L (ref 38–126)
Anion gap: 11 (ref 5–15)
BUN: 7 mg/dL (ref 6–20)
CO2: 24 mmol/L (ref 22–32)
Calcium: 8.4 mg/dL — ABNORMAL LOW (ref 8.9–10.3)
Chloride: 99 mmol/L (ref 98–111)
Creatinine, Ser: 0.7 mg/dL (ref 0.44–1.00)
GFR, Estimated: >60 mL/min (ref >60)
Glucose, Bld: 129 mg/dL — ABNORMAL HIGH (ref 70–99)
Potassium: 3.5 mmol/L (ref 3.5–5.1)
Sodium: 134 mmol/L — ABNORMAL LOW (ref 135–145)
Total Bilirubin: 0.4 mg/dL (ref 0.3–1.2)
Total Protein: 6.9 g/dL (ref 6.5–8.1)

## 2020-10-14 LAB — CBC WITH DIFFERENTIAL/PLATELET
Abs Immature Granulocytes: 0.14 10*3/uL — ABNORMAL HIGH (ref 0.00–0.07)
Basophils Absolute: 0.1 10*3/uL (ref 0.0–0.1)
Basophils Relative: 1 %
Eosinophils Absolute: 0.1 10*3/uL (ref 0.0–0.5)
Eosinophils Relative: 1 %
HCT: 25.9 % — ABNORMAL LOW (ref 36.0–46.0)
Hemoglobin: 7.8 g/dL — ABNORMAL LOW (ref 12.0–15.0)
Immature Granulocytes: 1 %
Lymphocytes Relative: 7 %
Lymphs Abs: 1 10*3/uL (ref 0.7–4.0)
MCH: 21.3 pg — ABNORMAL LOW (ref 26.0–34.0)
MCHC: 30.1 g/dL (ref 30.0–36.0)
MCV: 70.8 fL — ABNORMAL LOW (ref 80.0–100.0)
Monocytes Absolute: 0.9 10*3/uL (ref 0.1–1.0)
Monocytes Relative: 6 %
Neutro Abs: 12.4 10*3/uL — ABNORMAL HIGH (ref 1.7–7.7)
Neutrophils Relative %: 84 %
Platelets: 402 10*3/uL — ABNORMAL HIGH (ref 150–400)
RBC: 3.66 MIL/uL — ABNORMAL LOW (ref 3.87–5.11)
RDW: 21.9 % — ABNORMAL HIGH (ref 11.5–15.5)
WBC: 14.6 10*3/uL — ABNORMAL HIGH (ref 4.0–10.5)
nRBC: 0 % (ref 0.0–0.2)

## 2020-10-14 MED ORDER — METOPROLOL SUCCINATE ER 25 MG PO TB24: 25.0000 mg | ORAL_TABLET | Freq: Every day | ORAL | Status: DC

## 2020-10-14 MED ORDER — POTASSIUM CHLORIDE CRYS ER 20 MEQ PO TBCR: 40.0000 meq | EXTENDED_RELEASE_TABLET | Freq: Every day | ORAL | Status: DC

## 2020-10-14 MED ORDER — ALPRAZOLAM 0.5 MG PO TABS
0.5000 mg | ORAL_TABLET | Freq: Two times a day (BID) | ORAL | Status: DC
  Administered 2020-10-14 – 2020-10-17 (×8): 0.5 mg via ORAL
  Filled 2020-10-14 (×8): qty 1

## 2020-10-14 MED ORDER — LIP MEDEX EX OINT
1.0000 "application " | TOPICAL_OINTMENT | CUTANEOUS | Status: DC | PRN
  Filled 2020-10-14: qty 7

## 2020-10-14 MED ORDER — METOPROLOL TARTRATE 25 MG PO TABS
25.0000 mg | ORAL_TABLET | Freq: Two times a day (BID) | ORAL | Status: DC
  Administered 2020-10-14 – 2020-10-15 (×4): 25 mg via ORAL
  Filled 2020-10-14 (×4): qty 1

## 2020-10-14 MED ORDER — POTASSIUM CHLORIDE CRYS ER 20 MEQ PO TBCR
40.0000 meq | EXTENDED_RELEASE_TABLET | Freq: Two times a day (BID) | ORAL | Status: DC
  Administered 2020-10-14 – 2020-10-16 (×6): 40 meq via ORAL
  Filled 2020-10-14 (×7): qty 2

## 2020-10-14 MED ORDER — SODIUM CHLORIDE 0.9 % IV SOLN
510.0000 mg | Freq: Once | INTRAVENOUS | Status: DC
  Filled 2020-10-14: qty 17

## 2020-10-14 NOTE — Progress Notes (Signed)
   10/14/20 1213  Assess: MEWS Score  Temp 98.4 F (36.9 C)  BP (!) 148/98  Pulse Rate (!) 131  Resp 18  Level of Consciousness Alert  SpO2 99 %  O2 Device Room Air  Assess: MEWS Score  MEWS Temp 0  MEWS Systolic 0  MEWS Pulse 3  MEWS RR 0  MEWS LOC 0  MEWS Score 3  MEWS Score Color Yellow  Assess: if the MEWS score is Yellow or Red  Were vital signs taken at a resting state? Yes  Focused Assessment Change from prior assessment (see assessment flowsheet)  Early Detection of Sepsis Score *See Row Information* High  MEWS guidelines implemented *See Row Information* Yes  Treat  MEWS Interventions Escalated (See documentation below)  Take Vital Signs  Increase Vital Sign Frequency  Yellow: Q 2hr X 2 then Q 4hr X 2, if remains yellow, continue Q 4hrs  Escalate  MEWS: Escalate Yellow: discuss with charge nurse/RN and consider discussing with provider and RRT  Notify: Charge Nurse/RN  Name of Charge Nurse/RN Notified Delford Field RN  Date Charge Nurse/RN Notified 10/14/20  Time Charge Nurse/RN Notified 1213  Notify: Provider  Provider Name/Title Dr Mahala Menghini  Date Provider Notified 10/14/20  Time Provider Notified 1215  Notification Type Page  Notification Reason Change in status  Response See new orders;Other (Comment) (MD came to room to assess)  Date of Provider Response 10/14/20  Time of Provider Response 1218

## 2020-10-14 NOTE — Progress Notes (Signed)
PROGRESS NOTE   CALAIS SVEHLA  VEL:[id number] DOB: 1975/10/29 DOA: 10/12/2020 PCP: Department, [facility]  Brief Narrative:  45 year old black female known history iron deficiency anemia internal hemorrhoids?  Seen at [facility] by Dr. Clifton James LE gastroenterology-had endoscopy and colonoscopy apparently 08/2020-?  Gastric ulcer Seen in the emergency room recently with nausea vomiting persistent abdominal pain 1/25 with 5 bowel movements daily-given at emergency room Protonix fluids antiemetics found to have leukocytosis at the time CT abdomen pelvis = pain colitis with mesenteric adenopathy-patient preferred home trial of antibiotics Represented 1/27 similar symptoms found to be pretty tachycardic given IV fluid boluses started on empiric Cipro Flagyl IV and started because of positive C. difficile antigen on p.o. vancomycin   Assessment & Plan:   Principal Problem:   Sepsis (HCC) Active Problems:   Pancolitis (HCC)   Essential hypertension   Anemia   1. Infectious colitis with possible C. difficile superimposed a. Antibiotic coverage deferred to only vancomycin 125 4 times daily b. Diarrhea has improved and is less than prior c. Allow diet and graduated as tolerated d. Saline lockrf 2. Hypokalemia a. Replacing with p.o. potassium-recheck in the morning 3. Hyponatremia a. Full diet saline lock b. Resolving slowly 4. Iron deficiency anemia with possible dilutional component a. Exacerbated by possible colitis b. Monitor trends transfuse if below 7 c. Iron studies show very low iron saturation so give Feraheme x1 5. Sinus tachycardia HTN a. Resume amlodipine 10 b. Started this admission Toprol-XL increased dose to 25 daily   DVT prophylaxis: Lovenox full Code Status: Full Family Communication: None Disposition:  Status is: Inpatient  Remains inpatient appropriate because:Hemodynamically unstable, Persistent severe electrolyte disturbances and Altered mental  status   Dispo: The patient is from: Home              Anticipated d/c is to: Home              Anticipated d/c date is: 1 day              Patient currently is not medically stable to d/c.   Difficult to place patient No       Consultants:   None  Procedures: No  Antimicrobials: Vancomycin   Subjective:  Poorly having cough + abdominal pain Only 1 small stool today which was soft Eating food without no nausea no vomiting no chest pain   Objective: Vitals:   10/14/20 0200 10/14/20 0225 10/14/20 0709 10/14/20 0940  BP: 119/78 135/89 137/83 (!) 150/94  Pulse: 100 (!) 107 (!) 108 (!) 102  Resp: 12 18 18    Temp:  97.7 F (36.5 C) 98.1 F (36.7 C)   TempSrc:  Oral Oral   SpO2: 96% 100% 100%   Weight:      Height:        Intake/Output Summary (Last 24 hours) at 10/14/2020 1026 Last data filed at 10/14/2020 0940 Gross per 24 hour  Intake 357 ml  Output --  Net 357 ml   Filed Weights   10/12/20 1318 10/13/20 0047  Weight: 68 kg 67.3 kg    Examination:  Tired appearing black female Chest clear no added sound  Tender in right lower quadrant and right lower quadrant unchanged from prior No lower extremity edema Neurologically intact Power 5/5  Data Reviewed: I have personally reviewed following labs and imaging studies White count down from 20.6-->16.4-->14.6 Hemoglobin 8.9-->7.8  Iron level saturation ratio was 3 Potassium 3.5 LFTs normal   COVID-19 Labs  Recent Labs    10/13/20 0908  FERRITIN 66    Lab Results  Component Value Date   SARSCOV2NAA NEGATIVE 10/12/2020   SARSCOV2NAA NOT DETECTED 07/30/2019     Radiology Studies: [facility] Chest Port 1 View  Result Date: 10/12/2020 CLINICAL DATA:  Fatigue, nausea, blood in stool, questionable sepsis EXAM: PORTABLE CHEST 1 VIEW COMPARISON:  Portable exam 1412 hours without priors for comparison FINDINGS: Normal heart size, mediastinal contours, and pulmonary vascularity. Lungs clear. No infiltrate,  pleural effusion, or pneumothorax. Osseous structures unremarkable. IMPRESSION: No acute abnormalities. Electronically Signed   By: Ulyses Southward M.D.   On: 10/12/2020 14:31     Scheduled Meds: . amLODipine  10 mg Oral Daily  . Chlorhexidine Gluconate Cloth  6 each Topical Daily  . enoxaparin (LOVENOX) injection  40 mg Subcutaneous Q24H  . mouth rinse  15 mL Mouth Rinse BID  . metoprolol succinate  12.5 mg Oral Daily  . potassium chloride  40 mEq Oral BID  . vancomycin  125 mg Oral QID   Continuous Infusions: . ferumoxytol       LOS: 1 day    Time spent: 29  Rhetta Mura, MD [facility] To contact the attending provider between 7A-7P or the covering provider during after hours 7P-7A, please log into the web site [email or web address] and access using universal [place] password for that web site. If you do not have the password, please call the hospital operator.  10/14/2020, 10:26 AM

## 2020-10-14 NOTE — Plan of Care (Signed)
  Problem: Education: Goal: Knowledge of General Education information will improve Description: Including pain rating scale, medication(s)/side effects and non-pharmacologic comfort measures Outcome: Progressing   Problem: Health Behavior/Discharge Planning: Goal: Ability to manage health-related needs will improve Outcome: Progressing   Problem: Clinical Measurements: Goal: Ability to maintain clinical measurements within normal limits will improve Outcome: Progressing Goal: Will remain free from infection Outcome: Progressing Goal: Diagnostic test results will improve Outcome: Progressing Goal: Respiratory complications will improve Outcome: Progressing Goal: Cardiovascular complication will be avoided Outcome: Progressing   Problem: Activity: Goal: Risk for activity intolerance will decrease Outcome: Progressing   Problem: Coping: Goal: Level of anxiety will decrease Outcome: Progressing   Problem: Elimination: Goal: Will not experience complications related to bowel motility Outcome: Progressing Goal: Will not experience complications related to urinary retention Outcome: Progressing   Problem: Pain Managment: Goal: General experience of comfort will improve Outcome: Progressing   Problem: Safety: Goal: Ability to remain free from injury will improve Outcome: Progressing   Problem: Skin Integrity: Goal: Risk for impaired skin integrity will decrease Outcome: Progressing   Problem: Education: Goal: Ability to identify signs and symptoms of gastrointestinal bleeding will improve Outcome: Progressing   Problem: Fluid Volume: Goal: Will show no signs and symptoms of excessive bleeding Outcome: Progressing   Problem: Clinical Measurements: Goal: Complications related to the disease process, condition or treatment will be avoided or minimized Outcome: Progressing

## 2020-10-14 NOTE — Progress Notes (Signed)
Started Feraheme 510 mg IV.  Pt immediately states she cannot breath, feeling lightheaded and dizzy. Patient said "I feel like I'm having a seizure". Stopped Feraheme IV. Notified Dr Mahala Menghini and he arrived at bedside to assess.

## 2020-10-15 LAB — COMPREHENSIVE METABOLIC PANEL
ALT: 21 U/L (ref 0–44)
AST: 24 U/L (ref 15–41)
Albumin: 2.7 g/dL — ABNORMAL LOW (ref 3.5–5.0)
Alkaline Phosphatase: 61 U/L (ref 38–126)
Anion gap: 10 (ref 5–15)
BUN: 6 mg/dL (ref 6–20)
CO2: 25 mmol/L (ref 22–32)
Calcium: 8.4 mg/dL — ABNORMAL LOW (ref 8.9–10.3)
Chloride: 100 mmol/L (ref 98–111)
Creatinine, Ser: 0.68 mg/dL (ref 0.44–1.00)
GFR, Estimated: >60 mL/min (ref >60)
Glucose, Bld: 134 mg/dL — ABNORMAL HIGH (ref 70–99)
Potassium: 3.8 mmol/L (ref 3.5–5.1)
Sodium: 135 mmol/L (ref 135–145)
Total Bilirubin: 0.4 mg/dL (ref 0.3–1.2)
Total Protein: 6.8 g/dL (ref 6.5–8.1)

## 2020-10-15 LAB — CBC WITH DIFFERENTIAL/PLATELET
Abs Immature Granulocytes: 0.18 10*3/uL — ABNORMAL HIGH (ref 0.00–0.07)
Basophils Absolute: 0.1 10*3/uL (ref 0.0–0.1)
Basophils Relative: 1 %
Eosinophils Absolute: 0 10*3/uL (ref 0.0–0.5)
Eosinophils Relative: 0 %
HCT: 25.8 % — ABNORMAL LOW (ref 36.0–46.0)
Hemoglobin: 8.1 g/dL — ABNORMAL LOW (ref 12.0–15.0)
Immature Granulocytes: 1 %
Lymphocytes Relative: 13 %
Lymphs Abs: 1.7 10*3/uL (ref 0.7–4.0)
MCH: 21.9 pg — ABNORMAL LOW (ref 26.0–34.0)
MCHC: 31.4 g/dL (ref 30.0–36.0)
MCV: 69.7 fL — ABNORMAL LOW (ref 80.0–100.0)
Monocytes Absolute: 1.2 10*3/uL — ABNORMAL HIGH (ref 0.1–1.0)
Monocytes Relative: 9 %
Neutro Abs: 9.6 10*3/uL — ABNORMAL HIGH (ref 1.7–7.7)
Neutrophils Relative %: 76 %
Platelets: 459 10*3/uL — ABNORMAL HIGH (ref 150–400)
RBC: 3.7 MIL/uL — ABNORMAL LOW (ref 3.87–5.11)
RDW: 22.1 % — ABNORMAL HIGH (ref 11.5–15.5)
WBC: 12.8 10*3/uL — ABNORMAL HIGH (ref 4.0–10.5)
nRBC: 0.2 % (ref 0.0–0.2)

## 2020-10-15 MED ORDER — ONDANSETRON HCL 4 MG/2ML IJ SOLN
4.0000 mg | Freq: Four times a day (QID) | INTRAMUSCULAR | Status: DC | PRN
  Administered 2020-10-15: 4 mg via INTRAVENOUS
  Filled 2020-10-15: qty 2

## 2020-10-15 NOTE — Progress Notes (Signed)
PROGRESS NOTE   Kara Cain  YQM:[id number] DOB: Jul 02, 1976 DOA: 10/12/2020 PCP: Department, [facility]  Brief Narrative:  45 year old black female known history iron deficiency anemia internal hemorrhoids?  Seen at [facility] by Dr. Clifton James LE gastroenterology-had endoscopy and colonoscopy apparently 08/2020-?  Gastric ulcer Seen in the emergency room recently with nausea vomiting persistent abdominal pain 1/25 with 5 bowel movements daily-given at emergency room Protonix fluids antiemetics found to have leukocytosis at the time CT abdomen pelvis = pain colitis with mesenteric adenopathy-patient preferred home trial of antibiotics Represented 1/27 similar symptoms found to be pretty tachycardic given IV fluid boluses started on empiric Cipro Flagyl IV and started because of positive C. difficile antigen on p.o. vancomycin   Assessment & Plan:   Principal Problem:   Sepsis (HCC) Active Problems:   Pancolitis (HCC)   Essential hypertension   Anemia   1. Infectious colitis with possible C. difficile superimposed a. Continue Vancomycin 125 4 times daily b. Given diarrhea still persistent possibility is still having undifferentiated infectious colitis, c. Unasyn 1/30 d. Saline lock earlier in hospital stay 2. Hypokalemia a. Replacing with p.o. potassium-E. Dur 40 3. Hyponatremia a. Full diet saline lock b. Resolving slowly 4. Iron deficiency anemia with possible dilutional component a. Exacerbated by possible colitis b. Monitor trends transfuse if below 7 c. Iron studies show very low iron saturation but intolerant of Feraheme as below 5. Allergic reaction a. Secondary to possible Feraheme infusion so discontinued this b. No further recurrence 6. Probable component of anxiety a. Continue Xanax 0.5 twice daily started 7. Sinus tachycardia HTN a. Resume amlodipine 10 b. Started this admission Toprol-XL increased dose to 25 daily   DVT prophylaxis: Lovenox  full Code Status: Full Family Communication: None Disposition:  Status is: Inpatient  Remains inpatient appropriate because:Hemodynamically unstable, Persistent severe electrolyte disturbances and Altered mental status   Dispo: The patient is from: Home              Anticipated d/c is to: Home              Anticipated d/c date is: 3 days              Patient currently is not medically stable to d/c.   Difficult to place patient No       Consultants:   None  Procedures: No  Antimicrobials: Vancomycin   Subjective:  Doing fair 4 episodes of stool yesterday unformed No chest pain Still slightly tachycardic No further allergic type symptoms   Objective: Vitals:   10/15/20 0000 10/15/20 0300 10/15/20 0400 10/15/20 0928  BP: 116/79 118/79 111/81   Pulse: (!) 108 (!) 110 (!) 115 (!) 159  Resp: 19 (!) 22 20   Temp:  98.4 F (36.9 C)    TempSrc:  Oral    SpO2: 98% 99% 99%   Weight:      Height:        Intake/Output Summary (Last 24 hours) at 10/15/2020 1059 Last data filed at 10/14/2020 1300 Gross per 24 hour  Intake 240 ml  Output --  Net 240 ml   Filed Weights   10/12/20 1318 10/13/20 0047  Weight: 68 kg 67.3 kg    Examination:  Awake coherent no distress EOMI NCAT sleepy S1-S2 no murmur Abdomen slightly tender right lower quadrant No lower extremity edema ROM intact   Data Reviewed: I have personally reviewed following labs and imaging studies White count down from 20.6-->16.4-->14.6-->12.8 Hemoglobin 8.9-->7.8-->8.1 Potassium 3.8  COVID-19 Labs  Recent Labs    10/13/20 0908  FERRITIN 66    Lab Results  Component Value Date   SARSCOV2NAA NEGATIVE 10/12/2020   SARSCOV2NAA NOT DETECTED 07/30/2019     Radiology Studies: No results found.   Scheduled Meds: . ALPRAZolam  0.5 mg Oral BID  . amLODipine  10 mg Oral Daily  . Chlorhexidine Gluconate Cloth  6 each Topical Daily  . enoxaparin (LOVENOX) injection  40 mg Subcutaneous  Q24H  . mouth rinse  15 mL Mouth Rinse BID  . metoprolol tartrate  25 mg Oral BID  . potassium chloride  40 mEq Oral BID  . vancomycin  125 mg Oral QID   Continuous Infusions: . ferumoxytol       LOS: 2 days    Time spent: 71  Rhetta Mura, MD [facility] To contact the attending provider between 7A-7P or the covering provider during after hours 7P-7A, please log into the web site [email or web address] and access using universal [place] password for that web site. If you do not have the password, please call the hospital operator.  10/15/2020, 10:59 AM

## 2020-10-16 LAB — CBC WITH DIFFERENTIAL/PLATELET
Abs Immature Granulocytes: 0.15 10*3/uL — ABNORMAL HIGH (ref 0.00–0.07)
Basophils Absolute: 0 10*3/uL (ref 0.0–0.1)
Basophils Relative: 0 %
Eosinophils Absolute: 0.1 10*3/uL (ref 0.0–0.5)
Eosinophils Relative: 1 %
HCT: 26.4 % — ABNORMAL LOW (ref 36.0–46.0)
Hemoglobin: 7.9 g/dL — ABNORMAL LOW (ref 12.0–15.0)
Immature Granulocytes: 1 %
Lymphocytes Relative: 19 %
Lymphs Abs: 2.3 10*3/uL (ref 0.7–4.0)
MCH: 21.3 pg — ABNORMAL LOW (ref 26.0–34.0)
MCHC: 29.9 g/dL — ABNORMAL LOW (ref 30.0–36.0)
MCV: 71.2 fL — ABNORMAL LOW (ref 80.0–100.0)
Monocytes Absolute: 1.1 10*3/uL — ABNORMAL HIGH (ref 0.1–1.0)
Monocytes Relative: 9 %
Neutro Abs: 8.8 10*3/uL — ABNORMAL HIGH (ref 1.7–7.7)
Neutrophils Relative %: 70 %
Platelets: 512 10*3/uL — ABNORMAL HIGH (ref 150–400)
RBC: 3.71 MIL/uL — ABNORMAL LOW (ref 3.87–5.11)
RDW: 22.4 % — ABNORMAL HIGH (ref 11.5–15.5)
WBC: 12.5 10*3/uL — ABNORMAL HIGH (ref 4.0–10.5)
nRBC: 0.4 % — ABNORMAL HIGH (ref 0.0–0.2)

## 2020-10-16 LAB — COMPREHENSIVE METABOLIC PANEL
ALT: 32 U/L (ref 0–44)
AST: 31 U/L (ref 15–41)
Albumin: 2.5 g/dL — ABNORMAL LOW (ref 3.5–5.0)
Alkaline Phosphatase: 65 U/L (ref 38–126)
Anion gap: 9 (ref 5–15)
BUN: 6 mg/dL (ref 6–20)
CO2: 24 mmol/L (ref 22–32)
Calcium: 8.5 mg/dL — ABNORMAL LOW (ref 8.9–10.3)
Chloride: 101 mmol/L (ref 98–111)
Creatinine, Ser: 1.09 mg/dL — ABNORMAL HIGH (ref 0.44–1.00)
GFR, Estimated: >60 mL/min (ref >60)
Glucose, Bld: 122 mg/dL — ABNORMAL HIGH (ref 70–99)
Potassium: 4.3 mmol/L (ref 3.5–5.1)
Sodium: 134 mmol/L — ABNORMAL LOW (ref 135–145)
Total Bilirubin: 0.6 mg/dL (ref 0.3–1.2)
Total Protein: 7.1 g/dL (ref 6.5–8.1)

## 2020-10-16 MED ORDER — WITCH HAZEL-GLYCERIN EX PADS
MEDICATED_PAD | CUTANEOUS | Status: DC | PRN
  Administered 2020-10-16 – 2020-10-17 (×2): 1 via TOPICAL
  Filled 2020-10-16: qty 100

## 2020-10-16 MED ORDER — SODIUM CHLORIDE 0.9 % IV SOLN
3.0000 g | Freq: Four times a day (QID) | INTRAVENOUS | Status: AC
  Administered 2020-10-16 – 2020-10-19 (×12): 3 g via INTRAVENOUS
  Filled 2020-10-16 (×4): qty 3
  Filled 2020-10-16 (×3): qty 8
  Filled 2020-10-16: qty 3
  Filled 2020-10-16: qty 8
  Filled 2020-10-16: qty 3
  Filled 2020-10-16 (×2): qty 8
  Filled 2020-10-16: qty 3

## 2020-10-16 MED ORDER — HYDROCORTISONE ACETATE 25 MG RE SUPP
25.0000 mg | Freq: Two times a day (BID) | RECTAL | Status: DC
  Administered 2020-10-16 – 2020-10-21 (×5): 25 mg via RECTAL
  Filled 2020-10-16 (×11): qty 1

## 2020-10-16 MED ORDER — METOPROLOL TARTRATE 50 MG PO TABS
50.0000 mg | ORAL_TABLET | Freq: Two times a day (BID) | ORAL | Status: DC
  Administered 2020-10-16 (×2): 50 mg via ORAL
  Filled 2020-10-16 (×3): qty 1

## 2020-10-16 MED FILL — Fentanyl Citrate Preservative Free (PF) Inj 100 MCG/2ML: INTRAMUSCULAR | Qty: 2 | Status: AC

## 2020-10-16 NOTE — Progress Notes (Signed)
Pharmacy Antibiotic Note  Kara Cain is a 45 y.o. female admitted on 10/12/2020 with nausea, vomiting, diarrhea.  Pharmacy has been consulted for ampicillin/sulbactam dosing.  Pt currently on oral vancomycin for c.diff infection, ampicillin/sulbactam being initiated for infectious colitis.  Today, 10/16/20   WBC 12.5  SCr 1.09, CrCl ~60 mL/min  Afebrile  Plan:  Ampicillin/sulbactam 3 g IV q6h  Continue vancomycin 125 mg PO q6h  Height: 5\' 4"  (162.6 cm) Weight: 67.3 kg (148 lb 5.9 oz) IBW/kg (Calculated) : 54.7  Temp (24hrs), Avg:98.5 F (36.9 C), Min:98 F (36.7 C), Max:98.9 F (37.2 C)  Recent Labs  Lab 10/12/20 1337 10/12/20 1352 10/13/20 0002 10/13/20 0058 10/13/20 0908 10/13/20 1158 10/14/20 0839 10/15/20 0655 10/16/20 0525  WBC 20.6*  --  16.4*  --   --   --  14.6* 12.8* 12.5*  CREATININE 1.11*  --   --   --  0.50  --  0.70 0.68 1.09*  LATICACIDVEN  --  2.1* 1.3 1.3 1.1 1.2  --   --   --     Estimated Creatinine Clearance: 61.4 mL/min (A) (by C-G formula based on SCr of 1.09 mg/dL (H)).    Allergies  Allergen Reactions  . Hydrocodone Anxiety    Antimicrobials this admission: Ceftriaxone/metronidazole x1 dose 1/27 PO vancomycin 1/28 >>  Ampicillin/sulbactam 1/31 >>  Dose adjustments this admission:  Microbiology results: 1/27 BCx: ngtd 1/27 UCx: ngF  1/27 GI panel: Negative  Thank you for allowing pharmacy to be a part of this patient's care.  2/27, PharmD 10/16/2020 11:18 AM

## 2020-10-16 NOTE — Progress Notes (Signed)
PROGRESS NOTE   Kara Cain  YPP:[id number] DOB: 1976-04-29 DOA: 10/12/2020 PCP: Department, [facility]  Brief Narrative:  45 year old black female known history iron deficiency anemia internal hemorrhoids?  Seen at [facility] by Dr. Clifton James LE gastroenterology-had endoscopy and colonoscopy apparently 08/2020-?  Gastric ulcer Seen in the emergency room recently with nausea vomiting persistent abdominal pain 1/25 with 5 bowel movements daily-given at emergency room Protonix fluids antiemetics found to have leukocytosis at the time CT abdomen pelvis = pain colitis with mesenteric adenopathy-patient preferred home trial of antibiotics Represented 1/27 similar symptoms found to be pretty tachycardic given IV fluid boluses started on empiric Cipro Flagyl IV and started because of positive C. difficile antigen on p.o. vancomycin   Assessment & Plan:   Principal Problem:   Sepsis (HCC) Active Problems:   Pancolitis (HCC)   Essential hypertension   Anemia   1. Infectious colitis with possible C. difficile superimposed a. Continue Vancomycin 125 4 times daily b. Given diarrhea still persistent possibility is still having undifferentiated infectious colitis, started on 1/30 Unasyn to double cover and will need 2 weeks of vancomycin beyond this c. Saline lock earlier in hospital stay 2. Sinus tachycardia a. Probably related to pain in addition to volume depletion and anxiety b. Increase metoprolol to 50 twice daily 3. Hypokalemia a. Replacing with p.o. potassium-E. Dur 40 b. Resolving at this time 4. Hyponatremia a. Full diet saline lock  b. Resolving slowly 5. Iron deficiency anemia with possible dilutional component 6.  Likely hemorrhoids a. Exacerbated by possible colitis b. Monitor trends transfuse if below 7 c. Iron studies show very low iron saturation but intolerant of Feraheme as below  d. Added Anusol suppository for pain 7. Allergic reaction a. Secondary to  possible Feraheme infusion on 1/29 so discontinued this b. No further recurrence 8. Probable component of anxiety a. Continue Xanax 0.5 twice daily started 9. Sinus tachycardia HTN a. Resume amlodipine 10 b. See above discussion regarding sinus tach   DVT prophylaxis: Lovenox full Code Status: Full Family Communication: None Disposition:  Status is: Inpatient  Remains inpatient appropriate because:Hemodynamically unstable, Persistent severe electrolyte disturbances and Altered mental status   Dispo: The patient is from: Home              Anticipated d/c is to: Home              Anticipated d/c date is: 3 days              Patient currently is not medically stable to d/c.   Difficult to place patient No       Consultants:   None  Procedures: No  Antimicrobials: Vancomycin   Subjective:  Sitting up on the bedside commode Heart rates in the 150s No chest pain no fever Does not seem overtly anxious   Objective: Vitals:   10/15/20 2300 10/16/20 0000 10/16/20 0400 10/16/20 0821  BP: 98/69 108/69 111/68 135/77  Pulse: (!) 102 92 98 (!) 110  Resp: 19 16 (!) 22 18  Temp:  98 F (36.7 C)  98.7 F (37.1 C)  TempSrc:  Oral  Oral  SpO2: 100% 100% 100% 100%  Weight:      Height:        Intake/Output Summary (Last 24 hours) at 10/16/2020 0912 Last data filed at 10/15/2020 1730 Gross per 24 hour  Intake 360 ml  Output --  Net 360 ml   Filed Weights   10/12/20 1318 10/13/20 0047  Weight: 68 kg 67.3 kg    Examination: EOMI NCAT less sleepy Chest clear no added sound no rales no rhonchi Abdomen soft slightly tender lower quadrant no rebound no guarding S1-S2 sinus tach Neurologically intact moving all 4 limbs equally without focal deficit ROM intact skin intact    Data Reviewed: I have personally reviewed following labs and imaging studies White count down from 20.6-->16.4-->14.6-->12.8-->12.5 Hemoglobin 8.9-->7.8-->8.1-->7.9 Potassium  3.8-->4.3   COVID-19 Labs  No results for input(s): DDIMER, FERRITIN, LDH, CRP in the last 72 hours.  Lab Results  Component Value Date   SARSCOV2NAA NEGATIVE 10/12/2020   SARSCOV2NAA NOT DETECTED 07/30/2019     Radiology Studies: No results found.   Scheduled Meds: . ALPRAZolam  0.5 mg Oral BID  . amLODipine  10 mg Oral Daily  . Chlorhexidine Gluconate Cloth  6 each Topical Daily  . enoxaparin (LOVENOX) injection  40 mg Subcutaneous Q24H  . mouth rinse  15 mL Mouth Rinse BID  . metoprolol tartrate  25 mg Oral BID  . potassium chloride  40 mEq Oral BID  . vancomycin  125 mg Oral QID   Continuous Infusions:    LOS: 3 days    Time spent: 30  Rhetta Mura, MD [facility] To contact the attending provider between 7A-7P or the covering provider during after hours 7P-7A, please log into the web site [email or web address] and access using universal [place] password for that web site. If you do not have the password, please call the hospital operator.  10/16/2020, 9:12 AM

## 2020-10-16 NOTE — Plan of Care (Signed)
  Problem: Education: Goal: Knowledge of General Education information will improve Description: Including pain rating scale, medication(s)/side effects and non-pharmacologic comfort measures Outcome: Completed/Met   Problem: Activity: Goal: Risk for activity intolerance will decrease Outcome: Completed/Met   Problem: Nutrition: Goal: Adequate nutrition will be maintained Outcome: Completed/Met   Problem: Pain Managment: Goal: General experience of comfort will improve Outcome: Completed/Met   

## 2020-10-17 LAB — CULTURE, BLOOD (ROUTINE X 2)
Culture: NO GROWTH
Culture: NO GROWTH
Special Requests: ADEQUATE
Special Requests: ADEQUATE

## 2020-10-17 MED ORDER — MORPHINE SULFATE (PF) 2 MG/ML IV SOLN
1.0000 mg | INTRAVENOUS | Status: DC | PRN
Start: 2020-10-17 — End: 2020-10-18

## 2020-10-17 MED ORDER — OXYCODONE-ACETAMINOPHEN 7.5-325 MG PO TABS
1.0000 | ORAL_TABLET | ORAL | Status: DC | PRN
  Administered 2020-10-17: 1 via ORAL
  Administered 2020-10-17: 2 via ORAL
  Administered 2020-10-18: 1 via ORAL
  Filled 2020-10-17 (×2): qty 2

## 2020-10-17 MED ORDER — METOPROLOL TARTRATE 50 MG PO TABS
75.0000 mg | ORAL_TABLET | Freq: Two times a day (BID) | ORAL | Status: DC
  Administered 2020-10-17 – 2020-10-18 (×3): 75 mg via ORAL
  Filled 2020-10-17 (×3): qty 1

## 2020-10-17 NOTE — Progress Notes (Signed)
Offered to patient multiple times throughout shift to ambulate with her in the room; patient declined.  Encouraged patient to remain mobile to maintain strength and prevent further complications during hospitalization; patient verbalized understanding.

## 2020-10-17 NOTE — Progress Notes (Addendum)
PROGRESS NOTE   Kara Cain  IHK:[id number] DOB: 1976/06/27 DOA: 10/12/2020 PCP: Department, [facility]  Brief Narrative:  45 year old black female known history iron deficiency anemia internal hemorrhoids?  Seen at [facility] by Dr. Clifton James LE gastroenterology-had endoscopy and colonoscopy apparently 08/2020-?  Gastric ulcer Seen in the emergency room recently with nausea vomiting persistent abdominal pain 1/25 with 5 bowel movements daily-given at emergency room Protonix fluids antiemetics found to have leukocytosis at the time CT abdomen pelvis = pain colitis with mesenteric adenopathy-patient preferred home trial of antibiotics Represented 1/27 similar symptoms found to be pretty tachycardic given IV fluid boluses started on empiric Cipro Flagyl IV and started because of positive C. difficile antigen on p.o. vancomycin  Secondary to slow improvement added Unasyn on 1/31 which will need to be de-escalating as below She has had some blood in stool but this is probably minimal secondary to her colitis and she will need follow-up in the outpatient setting for the same in [place]/report 3 stabilizing and hopefully can discharge in the next several days   Assessment & Plan:   Principal Problem:   Sepsis (HCC) Active Problems:   Pancolitis (HCC)   Essential hypertension   Anemia   1. Infectious colitis with possible C. difficile superimposed a. Continue Vancomycin 125 4 times daily b. Given diarrhea still persistent possibility is still having undifferentiated infectious colitis, started on 1/30 Unasyn to double cover and will need 2 weeks of vancomycin beyond this c. Pain medication changed to predominantly Percocet 1 to 2 tablets-space out IV morphine to every 6 as needed severe pain 2. Sinus tachycardia a. Probably related to pain in addition to volume depletion and anxiety b. Increase metoprolol to 75 twice a day 3. Hypokalemia a. P.o. K. Dur 40 twice daily stop  2/1 b. Resolving at this time 4. Hyponatremia a. Full diet saline lock tolerating diet fairly well b. Resolving slowly 5. Iron deficiency anemia with possible dilutional component 6.  Likely hemorrhoids a. Exacerbated by possible colitis b. Monitor trends transfuse if below 7 c. Iron studies show very low iron saturation but intolerant of Feraheme as below  d. Added Anusol suppository for pain 7. Allergic reaction a. Secondary to possible Feraheme infusion on 1/29 so discontinued this b. No further recurrence 8. Probable component of anxiety a. Continue Xanax 0.5 twice daily started 9. Sinus tachycardia HTN a. Holding amlodipine sinus tach and is currently on metoprolol therefore can resume on discharge amlodipine b. See above discussion regarding sinus tach   DVT prophylaxis: Lovenox full Code Status: Full Family Communication: Discussed with partner on phone 2/1 Disposition:  Status is: Inpatient  Remains inpatient appropriate because:Hemodynamically unstable, Persistent severe electrolyte disturbances and Altered mental status   Dispo: The patient is from: Home              Anticipated d/c is to: Home              Anticipated d/c date is: 2 days              Patient currently is not medically stable to d/c.   Difficult to place patient No   Consultants:   None  Procedures: No  Antimicrobials: Vancomycin Unasyn started 1/31   Subjective:  Doing fair Has not ambulated out of bed much other than in the room Family concerned about the same No fever no chills Some blood in stool She had steak and potatoes yesterday Appetite is improving-she is a little bit  more alert   Objective: Vitals:   10/17/20 0113 10/17/20 0500 10/17/20 0622 10/17/20 0945  BP:   133/84 122/83  Pulse: 89 97 (!) 106 (!) 108  Resp: 16 20    Temp:   97.7 F (36.5 C)   TempSrc:   Oral   SpO2: 100% 100% 100%   Weight:      Height:        Intake/Output Summary (Last 24 hours) at  10/17/2020 1050 Last data filed at 10/17/2020 0200 Gross per 24 hour  Intake 300 ml  Output -  Net 300 ml   Filed Weights   10/12/20 1318 10/13/20 0047  Weight: 68 kg 67.3 kg    Examination:  EOMI NCAT no focal deficit less sleepy  S1-S2 no murmur no rub no gallop Chest clear no added sound no rales no rhonchi No lower extremity edema Neurologically intact moving all 4 limbs    Data Reviewed: I have personally reviewed following labs and imaging studies   No labs today   COVID-19 Labs  No results for input(s): DDIMER, FERRITIN, LDH, CRP in the last 72 hours.  Lab Results  Component Value Date   SARSCOV2NAA NEGATIVE 10/12/2020   SARSCOV2NAA NOT DETECTED 07/30/2019     Radiology Studies: No results found.   Scheduled Meds: . ALPRAZolam  0.5 mg Oral BID  . amLODipine  10 mg Oral Daily  . enoxaparin (LOVENOX) injection  40 mg Subcutaneous Q24H  . hydrocortisone  25 mg Rectal BID  . mouth rinse  15 mL Mouth Rinse BID  . metoprolol tartrate  50 mg Oral BID  . potassium chloride  40 mEq Oral BID  . vancomycin  125 mg Oral QID   Continuous Infusions: . ampicillin-sulbactam (UNASYN) IV 3 g (10/17/20 0619)     LOS: 4 days    Time spent: 30  Rhetta Mura, MD [facility] To contact the attending provider between 7A-7P or the covering provider during after hours 7P-7A, please log into the web site [email or web address] and access using universal [place] password for that web site. If you do not have the password, please call the hospital operator.  10/17/2020, 10:50 AM

## 2020-10-17 NOTE — Plan of Care (Signed)
  Problem: Health Behavior/Discharge Planning: Goal: Ability to manage health-related needs will improve Outcome: Progressing   Problem: Clinical Measurements: Goal: Will remain free from infection Outcome: Progressing   Problem: Clinical Measurements: Goal: Diagnostic test results will improve Outcome: Progressing   Problem: Safety: Goal: Ability to remain free from injury will improve Outcome: Progressing   Problem: Bowel/Gastric: Goal: Will show no signs and symptoms of gastrointestinal bleeding Outcome: Progressing   Problem: Clinical Measurements: Goal: Complications related to the disease process, condition or treatment will be avoided or minimized Outcome: Progressing

## 2020-10-18 DIAGNOSIS — I700 Atherosclerosis of aorta: Secondary | ICD-10-CM

## 2020-10-18 DIAGNOSIS — D509 Iron deficiency anemia, unspecified: Secondary | ICD-10-CM

## 2020-10-18 MED ORDER — ALPRAZOLAM 0.5 MG PO TABS: 0.5000 mg | ORAL_TABLET | Freq: Two times a day (BID) | ORAL | Status: DC | PRN

## 2020-10-18 MED ORDER — ACETAMINOPHEN 325 MG PO TABS: 650.0000 mg | ORAL_TABLET | Freq: Four times a day (QID) | ORAL | Status: DC | PRN

## 2020-10-18 MED ORDER — OXYCODONE-ACETAMINOPHEN 7.5-325 MG PO TABS
1.0000 | ORAL_TABLET | Freq: Four times a day (QID) | ORAL | Status: DC | PRN
  Administered 2020-10-18 – 2020-10-20 (×5): 1 via ORAL
  Filled 2020-10-18 (×5): qty 1

## 2020-10-18 NOTE — Plan of Care (Signed)
Pt ambulates in room and in the hallway for 70 ft, tolerated fair, complains of weakness of legs but no complains of dizziness.   Problem: Education: Goal: Ability to identify signs and symptoms of gastrointestinal bleeding will improve Outcome: Progressing   Problem: Bowel/Gastric: Goal: Will show no signs and symptoms of gastrointestinal bleeding Outcome: Progressing   Problem: Clinical Measurements: Goal: Ability to maintain clinical measurements within normal limits will improve Outcome: Progressing

## 2020-10-18 NOTE — Plan of Care (Signed)
  Problem: Clinical Measurements: Goal: Respiratory complications will improve Outcome: Completed/Met Goal: Cardiovascular complication will be avoided Outcome: Completed/Met   Problem: Coping: Goal: Level of anxiety will decrease Outcome: Completed/Met

## 2020-10-18 NOTE — Hospital Course (Addendum)
44yow presented with nausea, vomiting and diarrhea, admitted for pancolitis based on CT.  C. difficile testing was equivocal.  Treated empirically for C. difficile colitis.  Slowly improving.  Seen by gastroenterology.  Likely discharge next 24 hours.  A & P  Pancolitis, presumed infectious secondary to C. Difficile w/ associated sepsis. Seen by GI [facility] [name] Confer 07/2020 for IDA. 12/21 w/ EGD path: no sprue, chronic active gastritis, bacteria c/w helicobacter. Colon path: no microscopic colitis, no sig inflammation. No reports available. Capsule was schedule 09/20/20 but no show. Tx'd w/ Flagyl, TCN, Pepto.  --Continues to slowly improve.  Continue vancomycin.  Follow-up with gastroenterology as an outpatient.  Iron deficiency anemia --Intolerant of Feraheme, now on allergy list --Hemoglobin stable.  Iron supplementation on discharge.  Follow-up with gastroenterology as an outpatient.  Hemorrhoids --Anusol helping  Mesenteric adenopathy cyst similar 2 but progressed since prior study in 2021.  --Favored to be reactive in etiology. A 3-6 month follow-up CT would be useful to confirm resolution or stability.  Aortic atherosclerosis --No inpatient treatment warranted  RESOLVED Hypokalemia  Hyponatremia  Outpatient PT

## 2020-10-18 NOTE — Progress Notes (Signed)
PROGRESS NOTE  ANUM PALECEK JME:[id number] DOB: [birth year] DOA: 10/12/2020 PCP: Department, [facility]  Brief History   44yow presented with nausea, vomiting and diarrhea, admitted for pancolitis based on CT.  C. difficile testing was equivocal.  Treated empirically for C. difficile colitis.  Unasyn was added  A & P  Pancolitis, presumed infectious secondary to C. Difficile w/ associated sepsis --Improving per patient.  Stool forming up.  Continue vancomycin.  Unasyn started 1/30, last dose 2/3. --Based on improvement, inflammatory seems unlikely.  Follow-up with GI as an outpatient. --Sinus tachycardia secondary to acute condition, no specific treatment  Modest hyponatremia, asymptomatic --Supportive care  Iron deficiency anemia --Reportedly intolerant of Feraheme, now on allergy list --Hemoglobin stable.  Continue iron supplementation on discharge.  Hemorrhoids --Anusol  Mesenteric adenopathy cyst similar 2 but progressed since prior study in 2021.  --This is favored to be reactive in etiology. A 3-6 month follow-up CT would be useful to confirm resolution or stability.  Aortic atherosclerosis --No inpatient treatment warranted  RESOLVED . Hypokalemia   Outpatient PT    Disposition Plan:  Discussion: improving, home hopefully 1-2 days  Status is: Inpatient  Remains inpatient appropriate because:Inpatient level of care appropriate due to severity of illness   Dispo: The patient is from: Home              Anticipated d/c is to: Home              Anticipated d/c date is: 2 days              Patient currently is not medically stable to d/c.   Difficult to place patient No  DVT prophylaxis: Place and maintain sequential compression device Start: 10/17/20 1050 Place and maintain sequential compression device Start: 10/13/20 0537   Code Status: Full Code Level of care: Med-Surg Family Communication: none  Brendia Sacks, MD  [facility] Direct contact: see [email or web address] (further directions at bottom of note if needed) 7PM-7AM contact night coverage as at bottom of note 10/18/2020, 6:02 PM  LOS: 5 days   Significant Hospital Events   .    Consults:  .    Procedures:  .   Significant Diagnostic Tests:  Marland Kitchen    Micro Data:  .    Antimicrobials:  .   Interval History/Subjective  CC: f/u diarrhea  Feels a little better; still has diarrhea, x2 today; loose but a less watery, no sig blood. Tolerating diet, no vomiting since admission. No h/o GI problems reported, was on abx as outpatient (?)  Objective   Vitals:  Vitals:   10/18/20 1650 10/18/20 1653  BP: 127/84 131/70  Pulse: 84 84  Resp:    Temp: 97.7 F (36.5 C) 97.7 F (36.5 C)  SpO2: 100% 100%    Exam:  Constitutional:   . Appears calm, uncomfortable, ill but not toxic ENMT:  . grossly normal hearing  . Lips appear normal . Tongue appears normal Respiratory:  . CTA bilaterally, no w/r/r.  . Respiratory effort normal.  Cardiovascular:  . RRR, no m/r/g . No LE extremity edema   . Telemetry SR Abdomen:  . Abdomen appears normal; no sig tenderness or masses Musculoskeletal:  . Digits/nails BUE: no clubbing, cyanosis, petechiae, infection Psychiatric:  . Mental status o Mood, affect appropriate  I have personally reviewed the following:   Today's Data  . Cr up to 1.09 . Hgb stable 7.9  Scheduled Meds: . hydrocortisone  25 mg Rectal BID  .  mouth rinse  15 mL Mouth Rinse BID  . vancomycin  125 mg Oral QID   Continuous Infusions: . ampicillin-sulbactam (UNASYN) IV 3 g (10/18/20 1754)    Principal Problem:   Pancolitis (HCC) Active Problems:   Sepsis (HCC)   Essential hypertension   Aortic atherosclerosis (HCC)   Iron deficiency anemia   LOS: 5 days   How to contact the [facility] Attending or Consulting provider 7A - 7P or covering provider during after hours 7P -7A, for this patient?  1. Check the care team in [facility] and  look for a) attending/consulting TRH provider listed and b) the [facility] team listed 2. Log into [email or web address] and use [place]'s universal password to access. If you do not have the password, please contact the hospital operator. 3. Locate the [facility] provider you are looking for under [facility] and page to a number that you can be directly reached. 4. If you still have difficulty reaching the provider, please page the [facility] (Director on Call) for the Hospitalists listed on amion for assistance.

## 2020-10-18 NOTE — Evaluation (Addendum)
Physical Therapy Evaluation Patient Details Name: TAIYANA KISSLER MRN: [id number] DOB: 03-20-1976 Today's Date: 10/18/2020   History of Present Illness  patient is a 45y.o. female with PMH significant for HTN, sickle cell anemia, presented with nausea vomiting persistent abdominal pain with 5 bowel movements daily and blood in stool- found to have positive C. difficile antigen.  Clinical Impression  Pt is a 45y.o. female admitted for above HPI.  Pt reports that she is independent with mobility at baseline. Pt required MIN guard and verbal cues for sit to stand transfers. Pt required MIN assist-MIN guard  for ambulation 151ft requiring 3  seated rest breaks 2/2 lightheadedness, fatigue, and muscle weakness. Pt displayed drifting right/left during gait with no LOB observed. Pt's BP was found to be stable and lightheadedness was improved throughout session following 1st seated rest break. Pt's son is available for supervision at home. Recommend home with family support. Pt will benefit from skilled PT to increase independence and safety with mobility.      Follow Up Recommendations Home Health PT    Equipment Recommendations  None recommended by PT    Recommendations for Other Services       Precautions / Restrictions Precautions Precautions: Fall Restrictions Weight Bearing Restrictions: No      Mobility  Bed Mobility Overal bed mobility: Modified Independent             General bed mobility comments: pt able to use UEs on bed to scoot to EOB    Transfers Overall transfer level: Needs assistance Equipment used: None Transfers: Sit to/from Stand Sit to Stand: Min guard         General transfer comment: x3; MIN guard for safety and cues for bil hand placement on bed or recliner for power up to stand.  Ambulation/Gait Ambulation/Gait assistance: Min assist;Min guard Gait Distance (Feet): 120 Feet- 40, 30, 10,40  Assistive device: IV Pole;None Gait Pattern/deviations:  Step-to pattern;Step-through pattern;Decreased stride length;Narrow base of support;Drifts right/left;Shuffle Gait velocity: decr   General Gait Details: pt with shuffling gait pattern initially, cues for increased step length during gait. Pt required MIN assist-MIN guard for safety as pt was unsteady and drifting Rt/Lt with no LOB, pt intermittenly furniture surfing during gait for stability. Pt required 2 seated and 1 standing rest break 2/2  lightheadedness, fatigue and muscle weakness. Pt used IV pole for ~89ft and was able to perform rest of gait without holding onto IV pole and with MIN assist-MIN guard from therapist for safety.  Stairs            Wheelchair Mobility    Modified Rankin (Stroke Patients Only)       Balance Overall balance assessment: Mild deficits observed, not formally tested Sitting-balance support: Feet supported Sitting balance-Leahy Scale: Good     Standing balance support: Single extremity supported;No upper extremity supported Standing balance-Leahy Scale: Fair Standing balance comment: pt unsteady in standing and with gait swaying Rt/Lt intermittently with no LOB.                             Pertinent Vitals/Pain Pain Assessment: No/denies pain    Home Living Family/patient expects to be discharged to:: Private residence Living Arrangements: Children;Other relatives Available Help at Discharge: Family Type of Home: House Home Access: Stairs to enter Entrance Stairs-Rails: None Entrance Stairs-Number of Steps: 1 Home Layout: Two level   Additional Comments: Pt reports that she has a son (16yo) and  niece that live with her and participate in school virtually at this time. pt sleeps on 2nd level of house.    Prior Function Level of Independence: Independent               Hand Dominance   Dominant Hand: Right    Extremity/Trunk Assessment   Upper Extremity Assessment Upper Extremity Assessment: Overall WFL for tasks  assessed    Lower Extremity Assessment Lower Extremity Assessment: Generalized weakness    Cervical / Trunk Assessment Cervical / Trunk Assessment: Normal  Communication   Communication: No difficulties  Cognition Arousal/Alertness: Awake/alert Behavior During Therapy: WFL for tasks assessed/performed Overall Cognitive Status: Within Functional Limits for tasks assessed                                        General Comments      Exercises General Exercises - Lower Extremity Long Arc Quad: AROM;Both;5 reps;Seated   Assessment/Plan    PT Assessment Patient needs continued PT services  PT Problem List Decreased strength;Decreased activity tolerance;Decreased balance;Decreased mobility       PT Treatment Interventions DME instruction;Gait training;Stair training;Functional mobility training;Therapeutic activities;Therapeutic exercise;Balance training;Patient/family education    PT Goals (Current goals can be found in the Care Plan section)  Acute Rehab PT Goals Patient Stated Goal: to regain strength and pt likes to do karaoke PT Goal Formulation: With patient Time For Goal Achievement: 11/01/20 Potential to Achieve Goals: Good    Frequency Min 3X/week   Barriers to discharge        Co-evaluation               AM-PAC PT "6 Clicks" Mobility  Outcome Measure Help needed turning from your back to your side while in a flat bed without using bedrails?: None Help needed moving from lying on your back to sitting on the side of a flat bed without using bedrails?: None Help needed moving to and from a bed to a chair (including a wheelchair)?: A Little Help needed standing up from a chair using your arms (e.g., wheelchair or bedside chair)?: A Little Help needed to walk in hospital room?: A Little Help needed climbing 3-5 steps with a railing? : A Little 6 Click Score: 20    End of Session Equipment Utilized During Treatment: Gait belt Activity  Tolerance: Patient tolerated treatment well Patient left: in chair;with call bell/phone within reach Nurse Communication: Mobility status PT Visit Diagnosis: Unsteadiness on feet (R26.81);Muscle weakness (generalized) (M62.81);Other abnormalities of gait and mobility (R26.89)    Time: 8938-1017 PT Time Calculation (min) (ACUTE ONLY): 36 min   Charges:              Loyal Gambler, SPT  Acute rehab    Adelena Desantiago 10/18/2020, 2:20 PM

## 2020-10-18 NOTE — Progress Notes (Addendum)
Received call from patient. PIV to right hand infusing at a KVO rate of 5 ml/hr. Patient endorses pain to IV site after completion of Ampicillin. IV flushed with saline, patient endorses pain after PIV being flushed. Redness noted to PIV site. PIV removed. Catheter intact. 2x2 gauze applied.   Attempted to flush PIV to left a.c, swelling noted to IV site. IV removed. Catheter intact. 2x2 gauze applied. Order placed for IV team to place additional IV.  No other needs identified. Will continue to monitor.

## 2020-10-19 DIAGNOSIS — A0472 Enterocolitis due to Clostridium difficile, not specified as recurrent: Secondary | ICD-10-CM

## 2020-10-19 LAB — BASIC METABOLIC PANEL
Anion gap: 11 (ref 5–15)
BUN: 5 mg/dL — ABNORMAL LOW (ref 6–20)
CO2: 26 mmol/L (ref 22–32)
Calcium: 8.4 mg/dL — ABNORMAL LOW (ref 8.9–10.3)
Chloride: 100 mmol/L (ref 98–111)
Creatinine, Ser: 0.92 mg/dL (ref 0.44–1.00)
GFR, Estimated: >60 mL/min (ref >60)
Glucose, Bld: 198 mg/dL — ABNORMAL HIGH (ref 70–99)
Potassium: 4.2 mmol/L (ref 3.5–5.1)
Sodium: 137 mmol/L (ref 135–145)

## 2020-10-19 NOTE — TOC Progression Note (Signed)
Transition of Care [facility]) - Progression Note    Patient Details  Name: Kara Cain MRN: [id number] Date of Birth: 31-Aug-1976  Transition of Care [facility]) CM/SW Contact  Elon Eoff, Olegario Messier, RN Phone Number: 10/19/2020, 12:39 PM  Clinical Narrative:PT recc HHPT-[facility] chosen for HHPT. Continue to monitor for d/c needs.       Expected Discharge Plan: Home w Home Health Services Barriers to Discharge: Continued Medical Work up  Expected Discharge Plan and Services Expected Discharge Plan: Home w Home Health Services   Discharge Planning Services: CM Consult Post Acute Care Choice: Home Health Living arrangements for the past 2 months: Single Family Home                           HH Arranged: PT HH Agency: [facility] Date HH Agency Contacted: 10/19/20 Time HH Agency Contacted: 1238 Representative spoke with at [facility] Agency: Kathlene November   Social Determinants of Health (SDOH) Interventions    Readmission Risk Interventions No flowsheet data found.

## 2020-10-19 NOTE — Progress Notes (Addendum)
PROGRESS NOTE  Kara Cain SJG:[id number] DOB: 1976/05/09 DOA: 10/12/2020 PCP: Department, [facility]  Brief History   44yow presented with nausea, vomiting and diarrhea, admitted for pancolitis based on CT.  C. difficile testing was equivocal.  Treated empirically for C. difficile colitis.  Unasyn was added  A & P  Pancolitis, presumed infectious secondary to C. Difficile w/ associated sepsis. Seen by GI [name] Confer 07/2020 for IDA. 12/21 w/ EGD path: no sprue, chronic active gastritis, bacteria c/w helicobacter. Colon path: no microscopic colitis, no sig inflammation. No reports available. Capsule was schedule 09/20/20 but no show. Tx'd w/ Flagyl, TCN, Pepto.  --Slowly improving.  Stop Unasyn.  Continue vancomycin.  Follow-up with gastroenterology as an outpatient.  Modest hyponatremia, asymptomatic --Resolved.  Iron deficiency anemia --Reportedly intolerant of Feraheme, now on allergy list --Hemoglobin stable.  Iron supplementation on discharge.  Hemorrhoids --Anusol  Mesenteric adenopathy cyst similar 2 but progressed since prior study in 2021.  --Favored to be reactive in etiology. A 3-6 month follow-up CT would be useful to confirm resolution or stability.  Aortic atherosclerosis --No inpatient treatment warranted  RESOLVED . Hypokalemia   Outpatient PT    Disposition Plan:  Discussion: improving, home hopefully 1-2 days  Status is: Inpatient  Remains inpatient appropriate because:Inpatient level of care appropriate due to severity of illness   Dispo: The patient is from: Home              Anticipated d/c is to: Home              Anticipated d/c date is: 2 days              Patient currently is not medically stable to d/c.   Difficult to place patient No  DVT prophylaxis: Place and maintain sequential compression device Start: 10/17/20 1050 Place and maintain sequential compression device Start: 10/13/20 0537   Code Status: Full  Code Level of care: Med-Surg Family Communication: none  Brendia Sacks, MD  [facility] Direct contact: see [email or web address] (further directions at bottom of note if needed) 7PM-7AM contact night coverage as at bottom of note 10/19/2020, 5:38 PM  LOS: 6 days   Significant Hospital Events   .    Consults:  . GI   Procedures:  .   Significant Diagnostic Tests:  Marland Kitchen    Micro Data:  .    Antimicrobials:  .   Interval History/Subjective  CC: f/u diarrhea  Feels a little better today.  A few stools yesterday.  Some blood with wiping.  Stool forming up a little bit.  No nausea or vomiting.  Tolerating diet.  Objective   Vitals:  Vitals:   10/19/20 1329 10/19/20 1738  BP: 140/81 131/81  Pulse: (!) 101 (!) 109  Resp: 20 18  Temp: 98.3 F (36.8 C) 98.3 F (36.8 C)  SpO2: 100% 100%    Exam:  Constitutional:   . Appears calm and comfortable ENMT:  . grossly normal hearing  Respiratory:  . CTA bilaterally, no w/r/r.  . Respiratory effort normal.  Cardiovascular:  . RRR, no m/r/g . No LE extremity edema   Abdomen:  . Soft ntnd Psychiatric:  . Mental status o Mood, affect appropriate  I have personally reviewed the following:   Today's Data  . Cr stable at .92   Scheduled Meds: . hydrocortisone  25 mg Rectal BID  . mouth rinse  15 mL Mouth Rinse BID  . vancomycin  125 mg  Oral QID   Continuous Infusions: . ampicillin-sulbactam (UNASYN) IV 3 g (10/19/20 0543)    Principal Problem:   Pancolitis (HCC) Active Problems:   Sepsis (HCC)   Essential hypertension   Aortic atherosclerosis (HCC)   Iron deficiency anemia   C. difficile colitis   LOS: 6 days   How to contact the [facility] Attending or Consulting provider 7A - 7P or covering provider during after hours 7P -7A, for this patient?  1. Check the care team in [facility] and look for a) attending/consulting TRH provider listed and b) the [facility] team listed 2. Log into [email or web address] and use 's  universal password to access. If you do not have the password, please contact the hospital operator. 3. Locate the [facility] provider you are looking for under [facility] and page to a number that you can be directly reached. 4. If you still have difficulty reaching the provider, please page the [facility] (Director on Call) for the Hospitalists listed on amion for assistance.

## 2020-10-19 NOTE — Consult Note (Signed)
[facility] Consult  Referring Provider: [facility] Primary Care Physician:  Department, [facility] Primary Gastroenterologist: Gentry Fitz  Reason for Consultation: Pancolitis, C. difficile positive  HPI: Kara Cain is a 45 y.o. female was admitted on 10/12/2020 with nausea, vomiting, diarrhea and blood in stool. Stool tested positive for C. difficile antigen but negative for toxin, GI pathogen panel was unremarkable. CT showed pancolonic infectious/inflammatory colitis with mesenteric lymphadenopathy from 10/10/2020. Patient has been receiving empiric treatment for C. Difficile. She states there is improvement in the frequency and consistency of bowel movement. Initially she was started on Unasyn on 1/30 and received last dose today.  Patient states she had an EGD and a colonoscopy at [facility] in 12/21 and was told she had peptic ulcers but colon apparently was unremarkable.  Patient has had several weeks of nausea, vomiting, diarrhea and blood in stool which was initially attributed to hemorrhoids. There is no history of colon cancer, ulcerative colitis or Crohn's disease.   Past Medical History:  Diagnosis Date  . Hypertension     Past Surgical History:  Procedure Laterality Date  . WISDOM TOOTH EXTRACTION      Prior to Admission medications   Medication Sig Start Date End Date Taking? Authorizing Provider  ciprofloxacin (CIPRO) 500 MG tablet Take 1 tablet (500 mg total) by mouth 2 (two) times daily. One po bid x 7 days 10/11/20  Yes Molpus, John, MD  oxyCODONE-acetaminophen (PERCOCET) 5-325 MG tablet Take 1 tablet by mouth every 6 (six) hours as needed for severe pain. 10/11/20  Yes Molpus, John, MD  metroNIDAZOLE (FLAGYL) 500 MG tablet Take 1 tablet (500 mg total) by mouth 3 (three) times daily. One po bid x 7 days 10/11/20   Molpus, Jonny Ruiz, MD    Current Facility-Administered Medications  Medication Dose Route Frequency Provider Last Rate  Last Admin  . acetaminophen (TYLENOL) tablet 650 mg  650 mg Oral Q6H PRN Standley Brooking, MD      . ALPRAZolam Prudy Feeler) tablet 0.5 mg  0.5 mg Oral BID PRN Standley Brooking, MD      . Ampicillin-Sulbactam (UNASYN) 3 g in sodium chloride 0.9 % 100 mL IVPB  3 g Intravenous Q6H Standley Brooking, MD 200 mL/hr at 10/19/20 0543 3 g at 10/19/20 0543  . hydrocortisone (ANUSOL-HC) suppository 25 mg  25 mg Rectal BID Rhetta Mura, MD   25 mg at 10/19/20 1119  . lip balm (CARMEX) ointment 1 application  1 application Topical PRN Rhetta Mura, MD      . MEDLINE mouth rinse  15 mL Mouth Rinse BID Eduard Clos, MD   15 mL at 10/18/20 1055  . metoprolol tartrate (LOPRESSOR) injection 5 mg  5 mg Intravenous Q6H PRN Rhetta Mura, MD      . ondansetron (ZOFRAN) injection 4 mg  4 mg Intravenous Q6H PRN Rhetta Mura, MD   4 mg at 10/15/20 1510  . oxyCODONE-acetaminophen (PERCOCET) 7.5-325 MG per tablet 1-2 tablet  1-2 tablet Oral Q6H PRN Standley Brooking, MD   1 tablet at 10/19/20 1118  . vancomycin (VANCOCIN) 50 mg/mL oral solution 125 mg  125 mg Oral QID Standley Brooking, MD   125 mg at 10/19/20 1120  . witch hazel-glycerin (TUCKS) pad   Topical PRN Rhetta Mura, MD   1 application at 10/17/20 2155    Allergies as of 10/12/2020 - Review Complete 10/12/2020  Allergen Reaction Noted  . Hydrocodone Anxiety 02/07/2018    Family History  Problem Relation Age of Onset  . Hypertension Mother   . Breast cancer Neg Hx     Social History   Socioeconomic History  . Marital status: Single    Spouse name: Not on file  . Number of children: Not on file  . Years of education: Not on file  . Highest education level: Not on file  Occupational History  . Not on file  Tobacco Use  . Smoking status: Never Smoker  . Smokeless tobacco: Never Used  Substance and Sexual Activity  . Alcohol use: Not Currently  . Drug use: Never  . Sexual activity: Not on file   Other Topics Concern  . Not on file  Social History Narrative  . Not on file   Social Determinants of Health   Financial Resource Strain: Not on file  Food Insecurity: Not on file  Transportation Needs: Not on file  Physical Activity: Not on file  Stress: Not on file  Social Connections: Not on file  Intimate Partner Violence: Not on file    Review of Systems:  As per HPI  Physical Exam: Vital signs in last 24 hours: Temp:  [97.5 F (36.4 C)-98.7 F (37.1 C)] 98.7 F (37.1 C) (02/03 1016) Pulse Rate:  [74-100] 100 (02/03 1016) Resp:  [18] 18 (02/03 1016) BP: (123-136)/(70-92) 136/84 (02/03 1016) SpO2:  [100 %] 100 % (02/03 1016) Last BM Date: 10/18/20  General:   Alert,  Well-developed, well-nourished, pleasant and cooperative in NAD Head:  Normocephalic and atraumatic. Eyes: Mild pallor Ears:  Normal auditory acuity. Nose:  No deformity, discharge,  or lesions. Mouth:  No deformity or lesions.  Oropharynx pink & moist. Neck:  Supple; no masses or thyromegaly. Lungs:  Clear throughout to auscultation.   No wheezes, crackles, or rhonchi. No acute distress. Heart:  Regular rate and rhythm; no murmurs, clicks, rubs,  or gallops. Extremities:  Without clubbing or edema. Neurologic:  Alert and  oriented x4;  grossly normal neurologically. Skin:  Intact without significant lesions or rashes. Psych:  Alert and cooperative. Normal mood and affect. Abdomen:  Soft, nontender and nondistended. No masses, hepatosplenomegaly or hernias noted. Normal bowel sounds, without guarding, and without rebound.         Lab Results: No results for input(s): WBC, HGB, HCT, PLT in the last 72 hours. BMET Recent Labs    10/19/20 0937  NA 137  K 4.2  CL 100  CO2 26  GLUCOSE 198*  BUN 5*  CREATININE 0.92  CALCIUM 8.4*   LFT No results for input(s): PROT, ALBUMIN, AST, ALT, ALKPHOS, BILITOT, BILIDIR, IBILI in the last 72 hours. PT/INR No results for input(s): LABPROT, INR in  the last 72 hours.  Studies/Results: No results found.  Impression: Diarrhea with C. difficile antigen positive, empirically being treated with vancomycin with improvement in symptoms since admission  Mild leukocytosis, likely related to pan colonic infection with C. Difficile Thrombocytosis, probably reactive Normal renal function  Severe iron deficiency anemia  Plan: Patient tolerating regular diet. Doing well on vancomycin 125 mg 4 times daily, first dose was on 10/13/20. Recommend a total of 14-day vancomycin course. If remains stable okay to DC home in a.m.Marland Kitchen Patient to follow-up with [place] as an outpatient for chronic iron deficiency anemia. Please recall GI if needed.    LOS: 6 days   [name], MD  10/19/2020, 1:03 PM

## 2020-10-19 NOTE — Progress Notes (Addendum)
Received report from Ardyth Harps RN. Patient alert, oriented & awake. Respirations even and unlabored. Call bell within reach. Bed low and locked. Will continue to monitor.

## 2020-10-19 NOTE — Progress Notes (Signed)
Shift Summary:  Remained alert and oriented x 4. Pain control with PRN pain medication throughout the shift. Last pain medication given at 1819. Unasyn discontinued per orders. Given PRN cooling pads for hemorrhoids. One small stool noted. No other needs identified. Will continue to monitor.

## 2020-10-20 MED ORDER — OXYCODONE-ACETAMINOPHEN 5-325 MG PO TABS: 1.0000 | ORAL_TABLET | Freq: Four times a day (QID) | ORAL | Status: DC | PRN

## 2020-10-20 MED ORDER — LACTATED RINGERS IV SOLN: INTRAVENOUS | Status: DC

## 2020-10-20 MED ORDER — OXYCODONE HCL 5 MG PO TABS
2.5000 mg | ORAL_TABLET | Freq: Four times a day (QID) | ORAL | Status: DC | PRN
  Administered 2020-10-20 – 2020-10-21 (×2): 5 mg via ORAL
  Filled 2020-10-20 (×2): qty 1

## 2020-10-20 NOTE — Progress Notes (Addendum)
Physical Therapy Treatment Patient Details Name: Kara Cain MRN: [id number] DOB: 1976-05-18 Today's Date: 10/20/2020    History of Present Illness patient is a 45y.o. female with PMH significant for HTN, sickle cell anemia, presented with nausea vomiting persistent abdominal pain with 5 bowel movements daily and blood in stool- found to have positive C. difficile antigen.    PT Comments    Upon entrance, pt initially declining mobility stating that she was in a bad mood and angry about the that way her morning went. PT encouraged pt and educated her on importance of mobility to regain independence and meet goals of returning home, pt was agreeable to session following. Pt reported mild lightheadedness in standing, HR found to be in 120's-150s and BP 155/128mmhg, pt denied any other symptoms. PT deferred further mobility 2/2 pt's increased HR and discussed with RN following session. Acute therapy to follow up during stay.       Follow Up Recommendations  Home health PT     Equipment Recommendations  None recommended by PT    Recommendations for Other Services       Precautions / Restrictions Precautions Precautions: Fall Restrictions Weight Bearing Restrictions: No    Mobility  Bed Mobility Overal bed mobility: Modified Independent             General bed mobility comments: pt able to use UEs on bed to scoot to EOB  Transfers Overall transfer level: Needs assistance Equipment used: None Transfers: Sit to/from Stand Sit to Stand: Supervision         General transfer comment: supervision for safety. Pt's HR in 120's reaching up to 150bpm in standing with BP 155/161mmHG. Pt mildly lightheaded but denied any other symptoms. PT deferred further mobility at this time 2/2 pt's vital signs during transfers.  Ambulation/Gait                 Stairs             Wheelchair Mobility    Modified Rankin (Stroke Patients Only)       Balance Overall  balance assessment: Mild deficits observed, not formally tested Sitting-balance support: Feet supported Sitting balance-Leahy Scale: Good     Standing balance support: No upper extremity supported Standing balance-Leahy Scale: Fair Standing balance comment: pt able to stand with MIN guard-supervision for safety with no LOB observed.                            Cognition Arousal/Alertness: Awake/alert Behavior During Therapy: WFL for tasks assessed/performed (Pt is frsutrated with her current situation.) Overall Cognitive Status: Within Functional Limits for tasks assessed                                        Exercises      General Comments        Pertinent Vitals/Pain Pain Assessment: No/denies pain    Home Living                      Prior Function            PT Goals (current goals can now be found in the care plan section) Acute Rehab PT Goals Patient Stated Goal: "I want to go home" PT Goal Formulation: With patient Time For Goal Achievement: 11/01/20 Potential to Achieve Goals: Good Progress towards  PT goals: Progressing toward goals    Frequency    Min 3X/week      PT Plan Current plan remains appropriate    Co-evaluation              AM-PAC PT "6 Clicks" Mobility   Outcome Measure  Help needed turning from your back to your side while in a flat bed without using bedrails?: None Help needed moving from lying on your back to sitting on the side of a flat bed without using bedrails?: None Help needed moving to and from a bed to a chair (including a wheelchair)?: A Little Help needed standing up from a chair using your arms (e.g., wheelchair or bedside chair)?: None Help needed to walk in hospital room?: None Help needed climbing 3-5 steps with a railing? : A Little 6 Click Score: 22    End of Session Equipment Utilized During Treatment: Gait belt Activity Tolerance: Treatment limited secondary to medical  complications (Comment) Patient left: in bed;with call bell/phone within reach Nurse Communication: Mobility status (vitals signs during session) PT Visit Diagnosis: Unsteadiness on feet (R26.81);Muscle weakness (generalized) (M62.81);Other abnormalities of gait and mobility (R26.89)     Time: 1610-9604 PT Time Calculation (min) (ACUTE ONLY): 17 min  Charges:  $Therapeutic Activity: 8-22 mins                     Skylinn Vialpando, SPT  Acute rehab     Terika Pillard 10/20/2020, 1:53 PM

## 2020-10-20 NOTE — Progress Notes (Signed)
PROGRESS NOTE  CHOLE DRIVER QPY:[id number] DOB: [birth year] DOA: 10/12/2020 PCP: Department, [facility]  Brief History   44yow presented with nausea, vomiting and diarrhea, admitted for pancolitis based on CT.  C. difficile testing was equivocal.  Treated empirically for C. difficile colitis.  Slowly improving.  Seen by gastroenterology.  Likely discharge next 24 hours.  A & P  Pancolitis, presumed infectious secondary to C. Difficile w/ associated sepsis. Seen by GI [facility] [name] Confer 07/2020 for IDA. 12/21 w/ EGD path: no sprue, chronic active gastritis, bacteria c/w helicobacter. Colon path: no microscopic colitis, no sig inflammation. No reports available. Capsule was schedule 09/20/20 but no show. Tx'd w/ Flagyl, TCN, Pepto.  --Continues to slowly improve.  Continue vancomycin.  Follow-up with gastroenterology as an outpatient.  Iron deficiency anemia --Intolerant of Feraheme, now on allergy list --Hemoglobin stable.  Iron supplementation on discharge.  Follow-up with gastroenterology as an outpatient.  Hemorrhoids --Anusol helping  Mesenteric adenopathy cyst similar 2 but progressed since prior study in 2021.  --Favored to be reactive in etiology. A 3-6 month follow-up CT would be useful to confirm resolution or stability.  Aortic atherosclerosis --No inpatient treatment warranted  RESOLVED . Hypokalemia  . Hyponatremia  Outpatient PT   Disposition Plan:  Discussion: Continues to improve.  We will give some fluids today for lightheadedness.  Likely home tomorrow.  Status is: Inpatient  Remains inpatient appropriate because:Inpatient level of care appropriate due to severity of illness   Dispo: The patient is from: Home              Anticipated d/c is to: Home              Anticipated d/c date is: 1 day              Patient currently is not medically stable to d/c.   Difficult to place patient No  DVT prophylaxis: Place and maintain sequential  compression device Start: 10/17/20 1050 Place and maintain sequential compression device Start: 10/13/20 0537   Code Status: Full Code Level of care: Med-Surg Family Communication: none  Brendia Sacks, MD  [facility] Direct contact: see [email or web address] (further directions at bottom of note if needed) 7PM-7AM contact night coverage as at bottom of note 10/20/2020, 5:36 PM  LOS: 7 days   Significant Hospital Events   .    Consults:  . GI   Procedures:  .   Significant Diagnostic Tests:  Marland Kitchen    Micro Data:  .    Antimicrobials:  .   Interval History/Subjective  CC: f/u diarrhea  Slowly improving; minimal blood with diarrhea.  3 or 4 stools yesterday.  Tolerating diet.  Gets dizzy when getting up.  Tachycardic earlier when upset.  Objective   Vitals:  Vitals:   10/19/20 2102 10/20/20 0512  BP: 139/84 137/85  Pulse: 88 (!) 101  Resp: 18 18  Temp: 98.2 F (36.8 C) 98.1 F (36.7 C)  SpO2: 100% 100%    Exam:  Constitutional:   . Appears calm and comfortable ENMT:  . grossly normal hearing  Respiratory:  . CTA bilaterally, no w/r/r.  . Respiratory effort normal.  Cardiovascular:  . RRR, no m/r/g Abdomen:  . Abdomen appears normal; no tenderness or masses Psychiatric:  . Mental status o Mood, affect appropriate  I have personally reviewed the following:   Today's Data  . No new data  Scheduled Meds: . hydrocortisone  25 mg Rectal BID  . mouth  rinse  15 mL Mouth Rinse BID  . vancomycin  125 mg Oral QID   Continuous Infusions: . lactated ringers 150 mL/hr at 10/20/20 1622    Principal Problem:   Pancolitis (HCC) Active Problems:   Sepsis (HCC)   Essential hypertension   Aortic atherosclerosis (HCC)   Iron deficiency anemia   C. difficile colitis   LOS: 7 days   How to contact the [facility] Attending or Consulting provider 7A - 7P or covering provider during after hours 7P -7A, for this patient?  1. Check the care team in [facility] and look for a)  attending/consulting [facility] provider listed and b) the [facility] team listed 2. Log into [email or web address] and use [facility] universal password to access. If you do not have the password, please contact the hospital operator. 3. Locate the [facility] provider you are looking for under [facility] and page to a number that you can be directly reached. 4. If you still have difficulty reaching the provider, please page the [facility] (Director on Call) for the Hospitalists listed on amion for assistance.

## 2020-10-21 ENCOUNTER — Other Ambulatory Visit (HOSPITAL_COMMUNITY): Payer: Self-pay | Admitting: Family Medicine

## 2020-10-21 MED ORDER — IRON (FERROUS SULFATE) 325 (65 FE) MG PO TABS: 325.0000 mg | ORAL_TABLET | Freq: Every day | ORAL | Status: AC

## 2020-10-21 MED ORDER — VANCOMYCIN 50 MG/ML ORAL SOLUTION: 125.0000 mg | Freq: Four times a day (QID) | ORAL | 0 refills | Status: AC

## 2020-10-21 MED ORDER — WITCH HAZEL-GLYCERIN EX PADS: MEDICATED_PAD | CUTANEOUS | 12 refills | Status: DC | PRN

## 2020-10-21 MED FILL — VANCOMYCIN HCL 125 MG CAPS: 125 | 9 days supply | Qty: 36 | Fill #0

## 2020-10-21 NOTE — Discharge Summary (Signed)
Physician Discharge Summary  Kara Cain:[id number] DOB: [birth year] DOA: 10/12/2020  PCP: Department, [name]  Admit date: 10/12/2020 Discharge date: 10/21/2020  Recommendations for Outpatient Follow-up:  Iron deficiency anemia --Intolerant of Feraheme, now on allergy list.  Discussed with pharmacy, no other IV formulation known to be safe at this point. --Hemoglobin stable.  Iron supplementation on discharge.  Follow-up with gastroenterology as an outpatient.  Mesenteric adenopathy cyst similar 2 but progressed since prior study in 2021.  --Favored to be reactive in etiology. A 3-6 month follow-up CT would be useful to confirm resolution or stability.    Follow-up Information    [facility].   Specialty: Home Health Services Why: [facility] physical therapy Contact information: [address] [place] [address] [phone]        Department, [name]. Schedule an appointment as soon as possible for a visit in 1 week(s).   Contact information: [address] [place] [address] [phone]        [name], MD. Schedule an appointment as soon as possible for a visit in 3 week(s).   Specialty: Gastroenterology Contact information: [address] [place] [address] [place] [address] [phone]                Discharge Diagnoses: Principal diagnosis is #1 Principal Problem:   Pancolitis (HCC) Active Problems:   Sepsis (HCC)   Essential hypertension   Aortic atherosclerosis (HCC)   Iron deficiency anemia   C. difficile colitis   Discharge Condition: improved Disposition: home  Diet recommendation:  Diet Orders (From admission, onward)    Start     Ordered   10/21/20 0000  Diet general        10/21/20 1059           Filed Weights   10/12/20 1318 10/13/20 0047 10/20/20 2054  Weight: 68 kg 67.3 kg 68.8 kg    HPI/Hospital Course:   44yow presented with nausea, vomiting and diarrhea,  admitted for pancolitis based on CT.  C. difficile testing was equivocal.  Treated empirically for C. difficile colitis.  Treated with oral vancomycin, seen by gastroenterology, condition gradually improved, discharged home in good condition.  A & P  Pancolitis, presumed infectious secondary to C. Difficile w/ associated sepsis. Seen by GI [name] [name] Confer 07/2020 for IDA. 12/21 w/ EGD path: no sprue, chronic active gastritis, bacteria c/w helicobacter. Colon path: no microscopic colitis, no sig inflammation. No reports available. Capsule was schedule 09/20/20 but no show. Tx'd w/ Flagyl, TCN, Pepto.  --Home on oral vancomycin.  Follow-up with gastroenterology as an outpatient.  Iron deficiency anemia --Intolerant of Feraheme, now on allergy list.  Discussed with pharmacy, no other IV formulation known to be safe at this point. --Hemoglobin stable.  Iron supplementation on discharge.  Follow-up with gastroenterology as an outpatient.  Hemorrhoids --Does not want Anusol.  Continue Tucks pads.  Mesenteric adenopathy cyst similar 2 but progressed since prior study in 2021.  --Favored to be reactive in etiology. A 3-6 month follow-up CT would be useful to confirm resolution or stability.  Aortic atherosclerosis --No inpatient treatment warranted  RESOLVED . Hypokalemia  . Hyponatremia  Outpatient PT   Today's assessment: S: CC: f/u diarrhea  Better, stools forming up, only a little blood. Tolerating diet.  O: Vitals:  Vitals:   10/21/20 0510 10/21/20 0904  BP: 124/82 130/80  Pulse: 86 98  Resp: 18 16  Temp: 98.1 F (36.7 C) 98 F (36.7 C)  SpO2: 99% 100%    Constitutional:  . Appears calm and comfortable ENMT:  . grossly normal hearing  Respiratory:  . CTA bilaterally, no w/r/r.  . Respiratory effort normal.  Cardiovascular:  . RRR, no m/r/g . No LE extremity edema   Abdomen:  . soft Psychiatric:  . Mental status o Mood, affect appropriate    Discharge  Instructions  Discharge Instructions    Diet general   Complete by: As directed    Discharge instructions   Complete by: As directed    Call your physician or seek immediate medical attention for bleeding, pain, severe diarrhea, fever, dizziness, weakness, or worsening of condition.   Increase activity slowly   Complete by: As directed      Allergies as of 10/21/2020      Reactions   Feraheme [ferumoxytol] Shortness Of Breath, Other (See Comments)   Dizzy, lightheadedness,    Hydrocodone Anxiety      Medication List    STOP taking these medications   ciprofloxacin 500 MG tablet Commonly known as: Cipro   metroNIDAZOLE 500 MG tablet Commonly known as: Flagyl     TAKE these medications   Iron (Ferrous Sulfate) 325 (65 Fe) MG Tabs Take 325 mg by mouth daily.   oxyCODONE-acetaminophen 5-325 MG tablet Commonly known as: Percocet Take 1 tablet by mouth every 6 (six) hours as needed for severe pain.   vancomycin 50 mg/mL  oral solution Commonly known as: VANCOCIN Take 2.5 mLs (125 mg total) by mouth 4 (four) times daily for 9 days.   witch hazel-glycerin pad Commonly known as: TUCKS Apply topically as needed for itching, hemorrhoids or irritation.      Allergies  Allergen Reactions  . Feraheme [Ferumoxytol] Shortness Of Breath and Other (See Comments)    Dizzy, lightheadedness,   . Hydrocodone Anxiety    The results of significant diagnostics from this hospitalization (including imaging, microbiology, ancillary and laboratory) are listed below for reference.    Significant Diagnostic Studies: CT Abdomen Pelvis W Contrast  Result Date: 10/10/2020 CLINICAL DATA:  Nausea and vomiting.  Blood in stool. EXAM: CT ABDOMEN AND PELVIS WITH CONTRAST TECHNIQUE: Multidetector CT imaging of the abdomen and pelvis was performed using the standard protocol following bolus administration of intravenous contrast. CONTRAST:  OMNIPAQUE IOHEXOL 300 MG/ML  SOLN COMPARISON:   August 03, 2020 FINDINGS: Lower chest: The lung bases are clear. The heart size is normal. Hepatobiliary: The liver is normal. Normal gallbladder.There is no biliary ductal dilation. Pancreas: Normal contours without ductal dilatation. No peripancreatic fluid collection. Spleen: Unremarkable. Adrenals/Urinary Tract: --Adrenal glands: Unremarkable. --Right kidney/ureter: No hydronephrosis or radiopaque kidney stones. --Left kidney/ureter: No hydronephrosis or radiopaque kidney stones. --Urinary bladder: Unremarkable. Stomach/Bowel: --Stomach/Duodenum: No hiatal hernia or other gastric abnormality. Normal duodenal course and caliber. --Small bowel: Unremarkable. --Colon: There is pancolonic wall thickening. Air-fluid levels are noted in the colon. --Appendix: Normal. Vascular/Lymphatic: Atherosclerotic calcification is present within the non-aneurysmal abdominal aorta, without hemodynamically significant stenosis. --there is mild retroperitoneal adenopathy. --mesenteric adenopathy is noted. --mild pelvic and perirectal lymphadenopathy is noted. There is adenopathy a along the IMV and SMV. Reproductive: There is a fibroid uterus. Other: No ascites or free air. The abdominal wall is normal. Musculoskeletal. No acute displaced fractures. IMPRESSION: 1. Findings are consistent with pancolonic infectious or inflammatory colitis. 2. Mesenteric adenopathy cyst similar 2 but progressed since prior study in 2021. This is favored to be reactive in etiology.  A 3-6 month follow-up CT would be useful to confirm resolution or stability. 3. Fibroid uterus. Aortic Atherosclerosis (ICD10-I70.0). Electronically Signed   By: Katherine Mantlehristopher  Green M.D.   On: 10/10/2020 23:54   DG Chest Port 1 View  Result Date: 10/12/2020 CLINICAL DATA:  Fatigue, nausea, blood in stool, questionable sepsis EXAM: PORTABLE CHEST 1 VIEW COMPARISON:  Portable exam 1412 hours without priors for comparison FINDINGS: Normal heart size, mediastinal contours,  and pulmonary vascularity. Lungs clear. No infiltrate, pleural effusion, or pneumothorax. Osseous structures unremarkable. IMPRESSION: No acute abnormalities. Electronically Signed   By: Ulyses SouthwardMark  Boles M.D.   On: 10/12/2020 14:31    Microbiology: Recent Results (from the past 240 hour(s))  SARS Coronavirus 2 by RT PCR (hospital order, performed in [facility] hospital lab) Nasopharyngeal Nasopharyngeal Swab     Status: None   Collection Time: 10/12/20  2:10 PM   Specimen: Nasopharyngeal Swab  Result Value Ref Range Status   SARS Coronavirus 2 NEGATIVE NEGATIVE Final    Comment: (NOTE) SARS-CoV-2 target nucleic acids are NOT DETECTED.  The SARS-CoV-2 RNA is generally detectable in upper and lower respiratory specimens during the acute phase of infection. The lowest concentration of SARS-CoV-2 viral copies this assay can detect is 250 copies / mL. A negative result does not preclude SARS-CoV-2 infection and should not be used as the sole basis for treatment or other patient management decisions.  A negative result may occur with improper specimen collection / handling, submission of specimen other than nasopharyngeal swab, presence of viral mutation(s) within the areas targeted by this assay, and inadequate number of viral copies (<250 copies / mL). A negative result must be combined with clinical observations, patient history, and epidemiological information.  Fact Sheet for Patients:   BoilerBrush.com.cyhttps://www.fda.gov/media/136312/download  Fact Sheet for Healthcare Providers: https://pope.com/https://www.fda.gov/media/136313/download  This test is not yet approved or  cleared by the [place] FDA and has been authorized for detection and/or diagnosis of SARS-CoV-2 by FDA under an Emergency Use Authorization (EUA).  This EUA will remain in effect (meaning this test can be used) for the duration of the COVID-19 declaration under Section 564(b)(1) of the Act, 21 U.S.C. section 360bbb-3(b)(1), unless the  authorization is terminated or revoked sooner.  Performed at [facility] [place], [address] [place], [place] [address]   Blood Culture (routine x 2)     Status: None   Collection Time: 10/12/20  3:00 PM   Specimen: BLOOD  Result Value Ref Range Status   Specimen Description   Final    BLOOD LEFT ANTECUBITAL Performed at [facility], [address] [place], [place] [address]    Special Requests   Final    BOTTLES DRAWN AEROBIC AND ANAEROBIC Blood Culture adequate volume Performed at [facility] [place], [address] [place], [place] [address]    Culture   Final    NO GROWTH 5 DAYS Performed at [facility], [address] [place], [place] [address]    Report Status 10/17/2020 FINAL  Final  Blood Culture (routine x 2)     Status: None   Collection Time: 10/12/20  3:00 PM   Specimen: BLOOD LEFT FOREARM  Result Value Ref Range Status   Specimen Description   Final    BLOOD LEFT FOREARM Performed at [facility] [place], [address] [place], [place] [address]    Special Requests   Final    BOTTLES DRAWN AEROBIC AND ANAEROBIC Blood  Culture adequate volume Performed at [facility], [address] [place], [place] [address]    Culture   Final    NO GROWTH 5 DAYS Performed at [facility], [address], [place], [place] [address]    Report Status 10/17/2020 FINAL  Final  Urine culture     Status: None   Collection Time: 10/12/20  3:16 PM   Specimen: In/Out Cath Urine  Result Value Ref Range Status   Specimen Description   Final    IN/OUT CATH URINE Performed at [facility], [address] [place], [place] [address]    Special Requests   Final    NONE Performed at [facility], [address] [place], [place] [address]    Culture   Final    NO GROWTH Performed at [facility], [address], [place], [place] [address]    Report Status 10/13/2020 FINAL  Final  Gastrointestinal  Panel by PCR , Stool     Status: None   Collection Time: 10/12/20  6:44 PM   Specimen: Stool  Result Value Ref Range Status   Campylobacter species NOT DETECTED NOT DETECTED Final   Plesimonas shigelloides NOT DETECTED NOT DETECTED Final   Salmonella species NOT DETECTED NOT DETECTED Final   Yersinia enterocolitica NOT DETECTED NOT DETECTED Final   Vibrio species NOT DETECTED NOT DETECTED Final   Vibrio cholerae NOT DETECTED NOT DETECTED Final   Enteroaggregative E coli (EAEC) NOT DETECTED NOT DETECTED Final   Enteropathogenic E coli (EPEC) NOT DETECTED NOT DETECTED Final   Enterotoxigenic E coli (ETEC) NOT DETECTED NOT DETECTED Final   Shiga like toxin producing E coli (STEC) NOT DETECTED NOT DETECTED Final   Shigella/Enteroinvasive E coli (EIEC) NOT DETECTED NOT DETECTED Final   Cryptosporidium NOT DETECTED NOT DETECTED Final   Cyclospora cayetanensis NOT DETECTED NOT DETECTED Final   Entamoeba histolytica NOT DETECTED NOT DETECTED Final   Giardia lamblia NOT DETECTED NOT DETECTED Final   Adenovirus F40/41 NOT DETECTED NOT DETECTED Final   Astrovirus NOT DETECTED NOT DETECTED Final   Norovirus GI/GII NOT DETECTED NOT DETECTED Final   Rotavirus A NOT DETECTED NOT DETECTED Final   Sapovirus (I, II, IV, and V) NOT DETECTED NOT DETECTED Final    Comment: Performed at [facility], [address] [place], [place] [address]  MRSA PCR Screening     Status: None   Collection Time: 10/13/20 12:37 AM   Specimen: Nasal Mucosa; Nasopharyngeal  Result Value Ref Range Status   MRSA by PCR NEGATIVE NEGATIVE Final    Comment:        The GeneXpert MRSA Assay (FDA approved for NASAL specimens only), is one component of a comprehensive MRSA colonization surveillance program. It is not intended to diagnose MRSA infection nor to guide or monitor treatment for MRSA infections. Performed at [facility], [address], [place], [place] [address]      Labs: Basic  Metabolic Panel: Recent Labs  Lab 10/15/20 0655 10/16/20 0525 10/19/20 0937  NA 135 134* 137  K 3.8 4.3 4.2  CL 100 101 100  CO2 25 24 26   GLUCOSE 134* 122* 198*  BUN 6 6 5*  CREATININE 0.68 1.09* 0.92  CALCIUM 8.4* 8.5* 8.4*   Liver Function Tests: Recent Labs  Lab 10/15/20 0655 10/16/20 0525  AST 24 31  ALT 21 32  ALKPHOS 61 65  BILITOT 0.4 0.6  PROT 6.8 7.1  ALBUMIN 2.7* 2.5*   CBC: Recent Labs  Lab 10/15/20 0655 10/16/20 0525  WBC 12.8* 12.5*  NEUTROABS 9.6* 8.8*  HGB 8.1* 7.9*  HCT 25.8* 26.4*  MCV 69.7* 71.2*  PLT 459* 512*    Principal Problem:   Pancolitis (HCC) Active Problems:   Sepsis (HCC)   Essential hypertension   Aortic atherosclerosis (HCC)   Iron deficiency anemia   C. difficile colitis   Time coordinating discharge: 35 minutes  Signed:  Brendia Sacks, MD  [facility]  10/21/2020, 5:55 PM

## 2020-10-23 MED FILL — FIRVANQ 50 MG/ML SOLN: 50 | 9 days supply | Qty: 300 | Fill #0

## 2020-12-08 ENCOUNTER — Telehealth: Payer: Self-pay | Admitting: Hematology and Oncology

## 2020-12-08 NOTE — Telephone Encounter (Signed)
Received a new hem referral from the [facility] Department for anemia. Kara Cain has been cld and scheduled to see Dr. Pamelia Hoit on 4/11 at 4:15pm. Pt needed an appt as late in the afternoon as possible due to her work schedule. Aware to arrive 20 minutes early.

## 2020-12-24 NOTE — Progress Notes (Signed)
[facility] CONSULT NOTE  Patient Care Team: Department, [facility] as PCP - General Hollice Espy, Vania Rea, DC as Referring Physician (Chiropractic Medicine)  CHIEF COMPLAINTS/PURPOSE OF CONSULTATION:  Newly diagnosed iron deficiency anemia  HISTORY OF PRESENTING ILLNESS:  Kara Cain 45 y.o. female is here because of recent diagnosis of anemia. She is referred by the [facility] Department. She was admitted from 10/12/20-10/21/20 for pancolitis and found to have iron deficiency anemia. She is intolerant of Feraheme and was discharged with oral iron. Labs on 10/13/20 showed Hg 7.8, HCT 23.7, platelets 21.2, iron saturation 3%, ferritin 66, B-12 1516. She presents to the clinic today for initial evaluation.  Since she left the hospital she has been taking 1 tablet of oral iron along with orange juice and she is able to keep it down.  During the hospitalization the suspected the cause of anemia could be related to GI bleeding because she had blood in the stool.  However she had colonoscopy in December 2021 which was apparently normal.  She is no longer bleeding and appears to be improving her energy levels.  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  I reviewed her records extensively and collaborated the history with the patient.  MEDICAL HISTORY:  Past Medical History:  Diagnosis Date  . Hypertension     SURGICAL HISTORY: Past Surgical History:  Procedure Laterality Date  . WISDOM TOOTH EXTRACTION      SOCIAL HISTORY: Social History   Socioeconomic History  . Marital status: Single    Spouse name: Not on file  . Number of children: Not on file  . Years of education: Not on file  . Highest education level: Not on file  Occupational History  . Not on file  Tobacco Use  . Smoking status: Never Smoker  . Smokeless tobacco: Never Used  Substance and Sexual Activity  . Alcohol use: Not Currently  . Drug use: Never  . Sexual activity: Not on file   Other Topics Concern  . Not on file  Social History Narrative  . Not on file   Social Determinants of Health   Financial Resource Strain: Not on file  Food Insecurity: Not on file  Transportation Needs: Not on file  Physical Activity: Not on file  Stress: Not on file  Social Connections: Not on file  Intimate Partner Violence: Not on file    FAMILY HISTORY: Family History  Problem Relation Age of Onset  . Hypertension Mother   . Breast cancer Neg Hx     ALLERGIES:  is allergic to feraheme [ferumoxytol] and hydrocodone.  MEDICATIONS:  Current Outpatient Medications  Medication Sig Dispense Refill  . Iron, Ferrous Sulfate, 325 (65 Fe) MG TABS Take 325 mg by mouth daily.    Marland Kitchen oxyCODONE-acetaminophen (PERCOCET) 5-325 MG tablet Take 1 tablet by mouth every 6 (six) hours as needed for severe pain. 20 tablet 0  . vancomycin (VANCOCIN) 125 MG capsule TAKE 1 CAPSULE BY MOUTH 4 TIMES DAILY FOR 9 DAYS. 36 capsule 0  . Vancomycin HCl 50 MG/ML SOLR TAKE 2.5 ML BY MOUTH 4 TIMES DAILY FOR 9 DAYS. 300 mL 0  . witch hazel-glycerin (TUCKS) pad APPLY TOPICALLY AS NEEDED FOR ITCHING, HEMORRHOIDS OR IRRITATION. 40 each 12   No current facility-administered medications for this visit.    REVIEW OF SYSTEMS:   Constitutional: Denies fevers, chills or abnormal night sweats Eyes: Denies blurriness of vision, double vision or watery eyes Ears, nose, mouth, throat,  and face: Denies mucositis or sore throat Respiratory: Denies cough, dyspnea or wheezes Cardiovascular: Denies palpitation, chest discomfort or lower extremity swelling Gastrointestinal:  Denies nausea, heartburn or change in bowel habits Skin: Denies abnormal skin rashes Lymphatics: Denies new lymphadenopathy or easy bruising Neurological:Denies numbness, tingling or new weaknesses Behavioral/Psych: Mood is stable, no new changes    All other systems were reviewed with the patient and are negative.  PHYSICAL EXAMINATION: ECOG  PERFORMANCE STATUS: 1 - Symptomatic but completely ambulatory  Vitals:   12/25/20 1538  BP: (!) 152/93  Pulse: 80  Resp: 20  Temp: 98.1 F (36.7 C)  SpO2: 100%   Filed Weights   12/25/20 1538  Weight: 152 lb 12.8 oz (69.3 kg)    GENERAL:alert, no distress and comfortable SKIN: skin color, texture, turgor are normal, no rashes or significant lesions EYES: normal, conjunctiva are pink and non-injected, sclera clear OROPHARYNX:no exudate, no erythema and lips, buccal mucosa, and tongue normal  NECK: supple, thyroid normal size, non-tender, without nodularity LYMPH:  no palpable lymphadenopathy in the cervical, axillary or inguinal LUNGS: clear to auscultation and percussion with normal breathing effort HEART: regular rate & rhythm and no murmurs and no lower extremity edema ABDOMEN:abdomen soft, non-tender and normal bowel sounds Musculoskeletal:no cyanosis of digits and no clubbing  PSYCH: alert & oriented x 3 with fluent speech NEURO: no focal motor/sensory deficits  LABORATORY DATA:  I have reviewed the data as listed Lab Results  Component Value Date   WBC 6.7 12/25/2020   HGB 10.4 (L) 12/25/2020   HCT 36.4 12/25/2020   MCV 71.9 (L) 12/25/2020   PLT 370 12/25/2020   Lab Results  Component Value Date   NA 137 10/19/2020   K 4.2 10/19/2020   CL 100 10/19/2020   CO2 26 10/19/2020    RADIOGRAPHIC STUDIES: I have personally reviewed the radiological reports and agreed with the findings in the report.  ASSESSMENT AND PLAN:  Iron deficiency anemia 08/03/2020: Hemoglobin 9.4, MCV 75.7, platelets 438 10/13/20 showed Hg 7.8, HCT 23.7, platelets 21.2, iron saturation 3%, ferritin 66, B-12 1516. Hospitalization 10/12/2020-10/21/2020: Intolerant to [employer] (she had tachycardia, flashing lights and the thought she was going to pass out) I discussed with her that if she needs IV iron in the future, we can premedicate her and then give IV iron.   Current treatment: Once a day  oral iron with orange juice (she is tolerating this very well) Today's hemoglobin is 10.4. She is asymptomatic and therefore we can watch and monitor Differential diagnosis: Bleeding but it has subsided  Return to clinic in 3 months with labs done ahead of time and follow-up.   All questions were answered. The patient knows to call the clinic with any problems, questions or concerns.   Sabas Sous, MD, MPH 12/25/2020    I, Molly Dorshimer, am acting as scribe for Serena Croissant, MD.  I have reviewed the above documentation for accuracy and completeness, and I agree with the above.

## 2020-12-25 ENCOUNTER — Inpatient Hospital Stay: Payer: BC Managed Care – PPO | Attending: Hematology and Oncology | Admitting: Hematology and Oncology

## 2020-12-25 ENCOUNTER — Other Ambulatory Visit: Payer: Self-pay

## 2020-12-25 ENCOUNTER — Other Ambulatory Visit: Payer: Self-pay | Admitting: Hematology and Oncology

## 2020-12-25 ENCOUNTER — Inpatient Hospital Stay: Payer: BC Managed Care – PPO

## 2020-12-25 VITALS — BP 152/93 | HR 80 | Temp 98.1°F | Resp 20 | Ht 64.0 in | Wt 152.8 lb

## 2020-12-25 DIAGNOSIS — D509 Iron deficiency anemia, unspecified: Secondary | ICD-10-CM

## 2020-12-25 LAB — CBC WITH DIFFERENTIAL (CANCER CENTER ONLY)
Abs Immature Granulocytes: 0.01 10*3/uL (ref 0.00–0.07)
Basophils Absolute: 0.1 10*3/uL (ref 0.0–0.1)
Basophils Relative: 1 %
Eosinophils Absolute: 0.1 10*3/uL (ref 0.0–0.5)
Eosinophils Relative: 2 %
HCT: 36.4 % (ref 36.0–46.0)
Hemoglobin: 10.4 g/dL — ABNORMAL LOW (ref 12.0–15.0)
Immature Granulocytes: 0 %
Lymphocytes Relative: 30 %
Lymphs Abs: 2 10*3/uL (ref 0.7–4.0)
MCH: 20.6 pg — ABNORMAL LOW (ref 26.0–34.0)
MCHC: 28.6 g/dL — ABNORMAL LOW (ref 30.0–36.0)
MCV: 71.9 fL — ABNORMAL LOW (ref 80.0–100.0)
Monocytes Absolute: 0.5 10*3/uL (ref 0.1–1.0)
Monocytes Relative: 7 %
Neutro Abs: 4.1 10*3/uL (ref 1.7–7.7)
Neutrophils Relative %: 60 %
Platelet Count: 370 10*3/uL (ref 150–400)
RBC: 5.06 MIL/uL (ref 3.87–5.11)
RDW: 27 % — ABNORMAL HIGH (ref 11.5–15.5)
WBC Count: 6.7 10*3/uL (ref 4.0–10.5)
nRBC: 0 % (ref 0.0–0.2)

## 2020-12-25 NOTE — Assessment & Plan Note (Addendum)
08/03/2020: Hemoglobin 9.4, MCV 75.7, platelets 438 10/13/20 showed Hg 7.8, HCT 23.7, platelets 21.2, iron saturation 3%, ferritin 66, B-12 1516. Hospitalization 10/12/2020-10/21/2020: Intolerant to [employer] did not feel he can tolerate any other iron infusion.  Differential diagnosis: Bleeding versus malabsorption

## 2020-12-26 LAB — IRON AND TIBC
Iron: 98 ug/dL (ref 41–142)
Saturation Ratios: 21 % (ref 21–57)
TIBC: 474 ug/dL — ABNORMAL HIGH (ref 236–444)
UIBC: 376 ug/dL (ref 120–384)

## 2020-12-26 LAB — FERRITIN: Ferritin: 13 ng/mL (ref 11–307)

## 2021-03-26 ENCOUNTER — Inpatient Hospital Stay: Payer: BC Managed Care – PPO | Attending: Hematology and Oncology

## 2021-03-26 ENCOUNTER — Other Ambulatory Visit: Payer: Self-pay

## 2021-03-26 DIAGNOSIS — D509 Iron deficiency anemia, unspecified: Secondary | ICD-10-CM

## 2021-03-26 LAB — CBC WITH DIFFERENTIAL (CANCER CENTER ONLY)
Abs Immature Granulocytes: 0.01 10*3/uL (ref 0.00–0.07)
Basophils Absolute: 0.1 10*3/uL (ref 0.0–0.1)
Basophils Relative: 1 %
Eosinophils Absolute: 0.1 10*3/uL (ref 0.0–0.5)
Eosinophils Relative: 2 %
HCT: 42.9 % (ref 36.0–46.0)
Hemoglobin: 13.9 g/dL (ref 12.0–15.0)
Immature Granulocytes: 0 %
Lymphocytes Relative: 27 %
Lymphs Abs: 1.9 10*3/uL (ref 0.7–4.0)
MCH: 24.8 pg — ABNORMAL LOW (ref 26.0–34.0)
MCHC: 32.4 g/dL (ref 30.0–36.0)
MCV: 76.6 fL — ABNORMAL LOW (ref 80.0–100.0)
Monocytes Absolute: 0.5 10*3/uL (ref 0.1–1.0)
Monocytes Relative: 7 %
Neutro Abs: 4.5 10*3/uL (ref 1.7–7.7)
Neutrophils Relative %: 63 %
Platelet Count: 280 10*3/uL (ref 150–400)
RBC: 5.6 MIL/uL — ABNORMAL HIGH (ref 3.87–5.11)
RDW: 18.9 % — ABNORMAL HIGH (ref 11.5–15.5)
WBC Count: 7.1 10*3/uL (ref 4.0–10.5)
nRBC: 0 % (ref 0.0–0.2)

## 2021-03-27 LAB — IRON AND TIBC
Iron: 81 ug/dL (ref 41–142)
Saturation Ratios: 20 % — ABNORMAL LOW (ref 21–57)
TIBC: 405 ug/dL (ref 236–444)
UIBC: 324 ug/dL (ref 120–384)

## 2021-03-27 LAB — FERRITIN: Ferritin: 19 ng/mL (ref 11–307)

## 2021-03-27 NOTE — Progress Notes (Signed)
Patient Care Team: Department, [facility] as PCP - General Mamie Nick, DC as Referring Physician (Chiropractic Medicine)  DIAGNOSIS:    ICD-10-CM   1. Iron deficiency anemia, unspecified iron deficiency anemia type  D50.9       CHIEF COMPLIANT: Follow-up of IDA  INTERVAL HISTORY: Kara Cain is a 45 y.o. with above-mentioned history of IDA. Labs on 03/27/21 showed Hg 13.9, HCT 42.9, platelets 280, iron saturation 20%, ferritin 19. She reports to the clinic today for follow-up.  No further issues with bleeding and she is tolerating oral iron therapy well and she is staying compliant on it.  ALLERGIES:  is allergic to feraheme [ferumoxytol] and hydrocodone.  MEDICATIONS:  Current Outpatient Medications  Medication Sig Dispense Refill   Iron, Ferrous Sulfate, 325 (65 Fe) MG TABS Take 325 mg by mouth daily.     oxyCODONE-acetaminophen (PERCOCET) 5-325 MG tablet Take 1 tablet by mouth every 6 (six) hours as needed for severe pain. 20 tablet 0   vancomycin (VANCOCIN) 125 MG capsule TAKE 1 CAPSULE BY MOUTH 4 TIMES DAILY FOR 9 DAYS. 36 capsule 0   Vancomycin HCl 50 MG/ML SOLR TAKE 2.5 ML BY MOUTH 4 TIMES DAILY FOR 9 DAYS. 300 mL 0   witch hazel-glycerin (TUCKS) pad APPLY TOPICALLY AS NEEDED FOR ITCHING, HEMORRHOIDS OR IRRITATION. 40 each 12   No current facility-administered medications for this visit.    PHYSICAL EXAMINATION: ECOG PERFORMANCE STATUS: 1 - Symptomatic but completely ambulatory  Vitals:   03/28/21 1524  BP: (!) 138/94  Pulse: 92  Resp: 18  Temp: 97.7 F (36.5 C)  SpO2: 100%   Filed Weights   03/28/21 1524  Weight: 168 lb 9.6 oz (76.5 kg)    LABORATORY DATA:  I have reviewed the data as listed CMP Latest Ref Rng & Units 10/19/2020 10/16/2020 10/15/2020  Glucose 70 - 99 mg/dL 240(X) 735(H) 299(M)  BUN 6 - 20 mg/dL 5(L) 6 6  Creatinine 4.26 - 1.00 mg/dL 8.34 1.96(Q) 2.29  Sodium 135 - 145 mmol/L 137 134(L) 135  Potassium 3.5 - 5.1 mmol/L  4.2 4.3 3.8  Chloride 98 - 111 mmol/L 100 101 100  CO2 22 - 32 mmol/L 26 24 25   Calcium 8.9 - 10.3 mg/dL ) 7.9(G) 9.2(J)  Total Protein 6.5 - 8.1 g/dL - 7.1 6.8  Total Bilirubin 0.3 - 1.2 mg/dL - 0.6 0.4  Alkaline Phos 38 - 126 U/L - 65 61  AST 15 - 41 U/L - 31 24  ALT 0 - 44 U/L - 32 21    Lab Results  Component Value Date   WBC 7.1 03/26/2021   HGB 13.9 03/26/2021   HCT 42.9 03/26/2021   MCV 76.6 (L) 03/26/2021   PLT 280 03/26/2021   NEUTROABS 4.5 03/26/2021    ASSESSMENT & PLAN:  Iron deficiency anemia 08/03/2020: Hemoglobin 9.4, MCV 75.7, platelets 438 10/13/20 showed Hg 7.8, HCT 23.7, platelets 21.2, iron saturation 3%, ferritin 66, B-12 1516. Hospitalization 10/12/2020-10/21/2020: Intolerant to 12/19/2020 (she had tachycardia, flashing lights and the thought she was going to pass out) I discussed with her that if she needs IV iron in the future, we can premedicate her and then give IV iron.   Current treatment: Once a day oral iron with orange juice (she is tolerating this very well) She is asymptomatic and therefore we can watch and monitor Differential diagnosis: Bleeding but it has subsided    Lab Review:  03/26/21: Hb 13.9, Iron Sat: 20%,  TIBC: 405, Ferritin 19  No need of IV iron at this time Patient's hemoglobin has improved all the way from 7 g to 13.9 g.  Since her bleeding stopped and the oral iron is being well-tolerated, I recommended that she can be seen by [place] on an as-needed basis.   No orders of the defined types were placed in this encounter.  The patient has a good understanding of the overall plan. she agrees with it. she will call with any problems that may develop before the next visit here.  Total time spent: 20 mins including face to face time and time spent for planning, charting and coordination of care  Sabas Sous, MD, MPH 03/28/2021  I, Alda Ponder, am acting as scribe for Dr. Serena Croissant.  I have reviewed the above documentation  for accuracy and completeness, and I agree with the above.

## 2021-03-27 NOTE — Assessment & Plan Note (Signed)
08/03/2020: Hemoglobin 9.4, MCV 75.7, platelets 438 10/13/20 showed Hg 7.8, HCT 23.7, platelets 21.2, iron saturation 3%, ferritin 66, B-12 1516. Hospitalization 10/12/2020-10/21/2020: Intolerant to [employer] (she had tachycardia, flashing lights and the thought she was going to pass out) I discussed with her that if she needs IV iron in the future, we can premedicate her and then give IV iron.   Current treatment: Once a day oral iron with orange juice (she is tolerating this very well) She is asymptomatic and therefore we can watch and monitor Differential diagnosis: Bleeding but it has subsided   Lab Review:  03/26/21: Hb 13.9, Iron Sat: 20%, TIBC: 405, Ferritin 19  No need of IV iron at this time Recheck in 3 months Return to clinic in 3 months with labs done ahead of time and follow-up.

## 2021-03-28 ENCOUNTER — Other Ambulatory Visit: Payer: Self-pay

## 2021-03-28 ENCOUNTER — Inpatient Hospital Stay (HOSPITAL_BASED_OUTPATIENT_CLINIC_OR_DEPARTMENT_OTHER): Payer: BC Managed Care – PPO | Admitting: Hematology and Oncology

## 2021-03-28 DIAGNOSIS — D509 Iron deficiency anemia, unspecified: Secondary | ICD-10-CM

## 2021-03-28 MED ORDER — AMLODIPINE BESYLATE 10 MG PO TABS: 10.0000 mg | ORAL_TABLET | Freq: Every day | ORAL | Status: AC

## 2022-02-12 IMAGING — CT CT ABD-PELV W/ CM
2 of 5 series · 16 of 46 positions shown, 18 images · IV contrast (Omnipaque)
Comparison: August 03, 2020

CLINICAL DATA: Nausea and vomiting.  Blood in stool.

EXAM:
CT ABDOMEN AND PELVIS WITH CONTRAST
TECHNIQUE: Multidetector CT imaging of the abdomen and pelvis was performed
using the standard protocol following bolus administration of
intravenous contrast.
CONTRAST:  100mL OMNIPAQUE IOHEXOL 300 MG/ML  SOLN

[Series 2: axial st · axial · 0.68mm/px · z∈[-301,+94]mm · 13 of 89 slices shown, 15 images]
[im 5/89  soft-tissue]
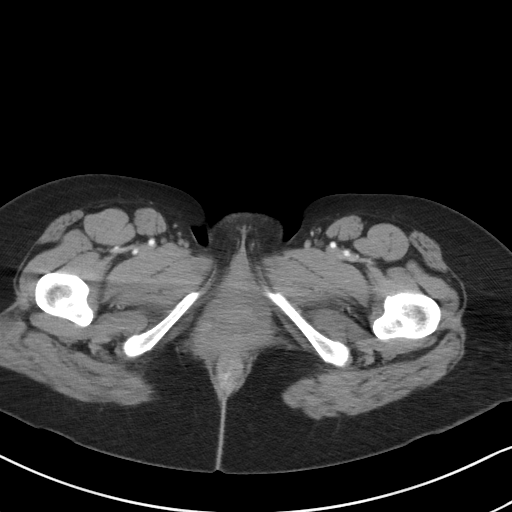
[im 5/89  bone]
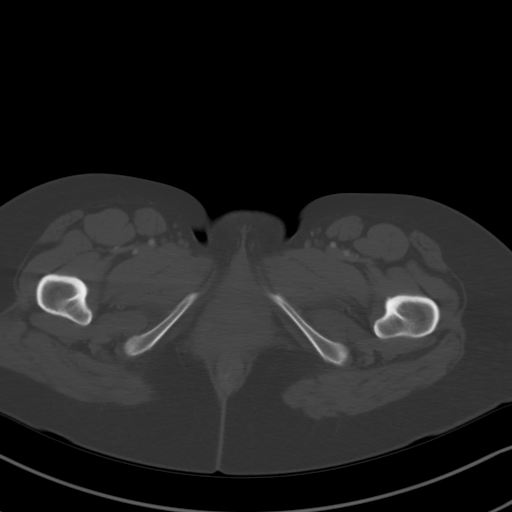
[im 14/89  soft-tissue]
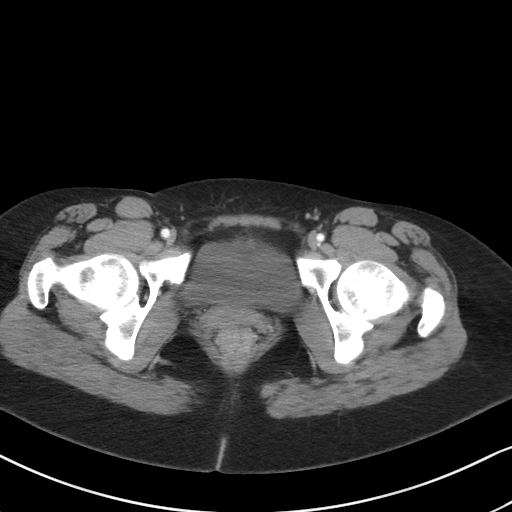
[im 18/89  soft-tissue]
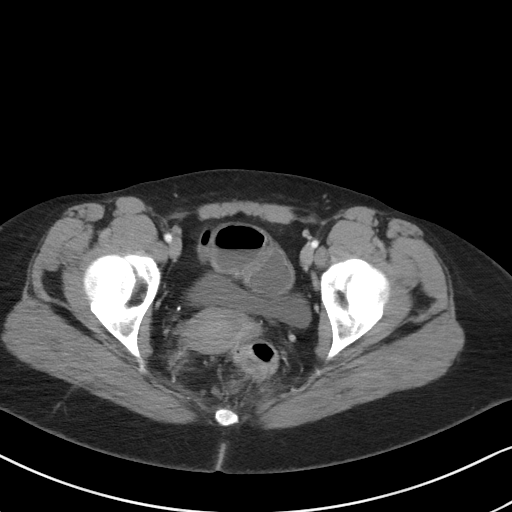
[im 27/89  soft-tissue]
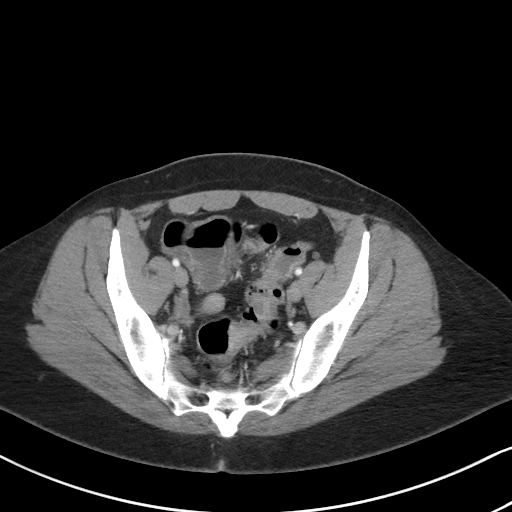
[im 31/89  soft-tissue]
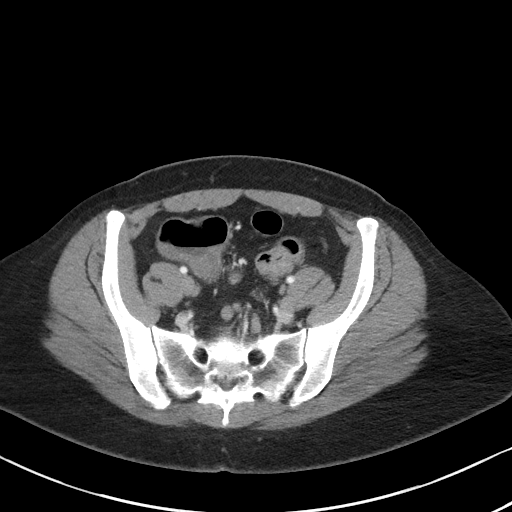
[im 40/89  soft-tissue]
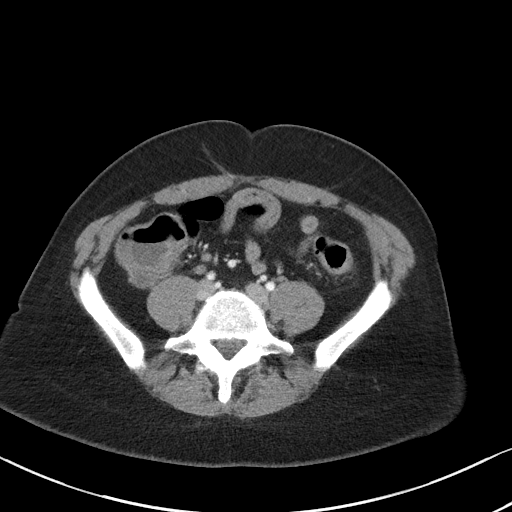
[im 45/89  soft-tissue]
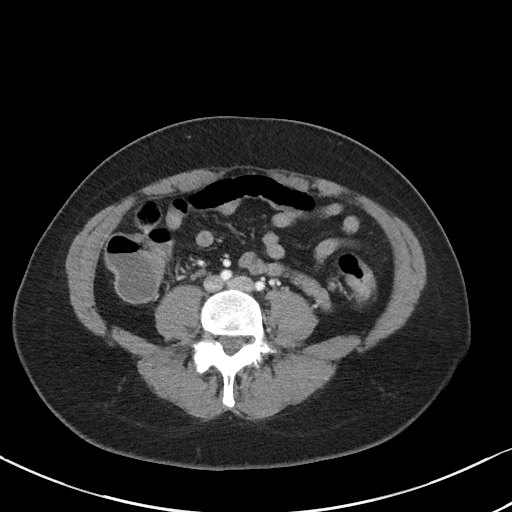
[im 49/89  soft-tissue]
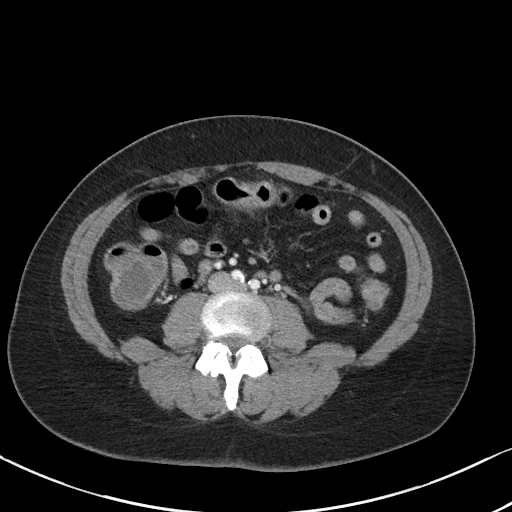
[im 58/89  soft-tissue]
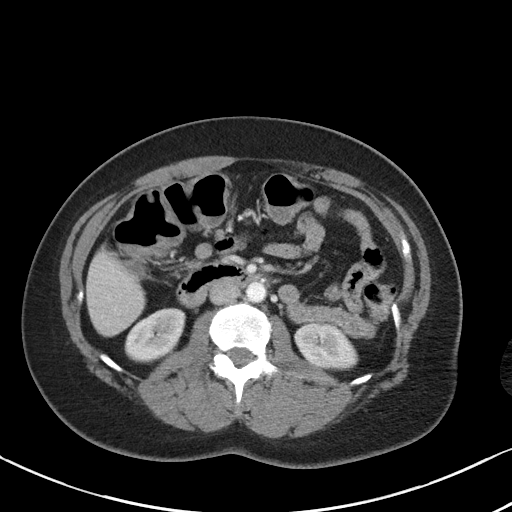
[im 58/89  bone]
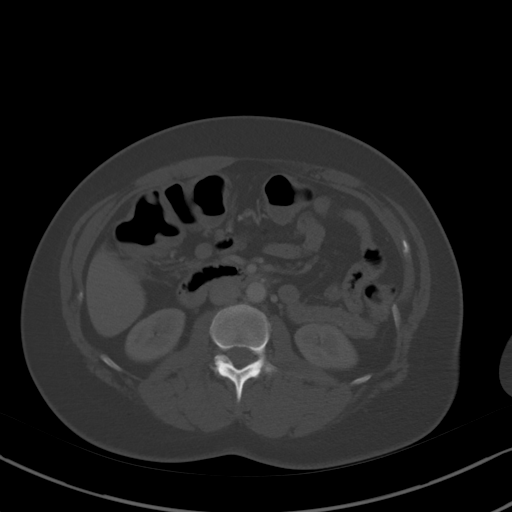
[im 62/89  soft-tissue]
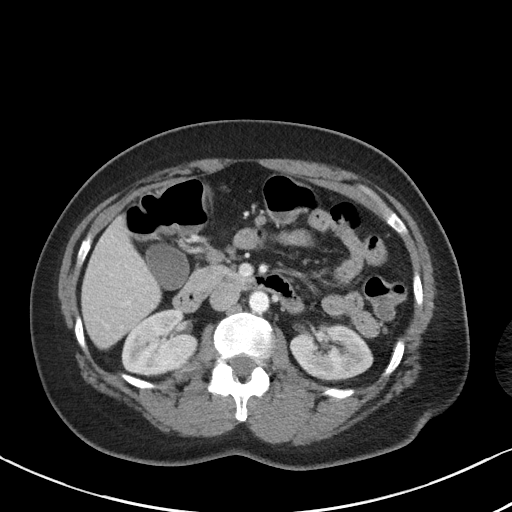
[im 71/89  soft-tissue]
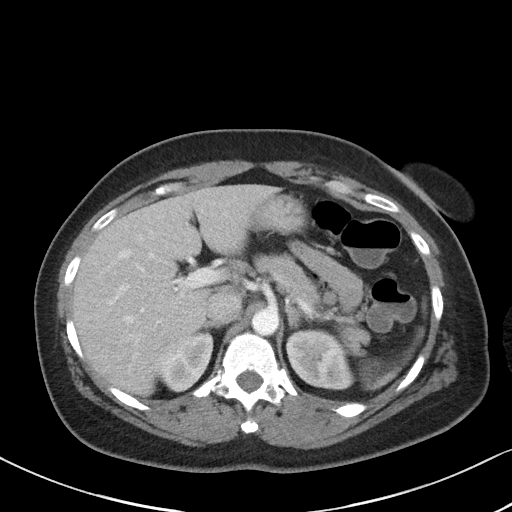
[im 75/89  soft-tissue]
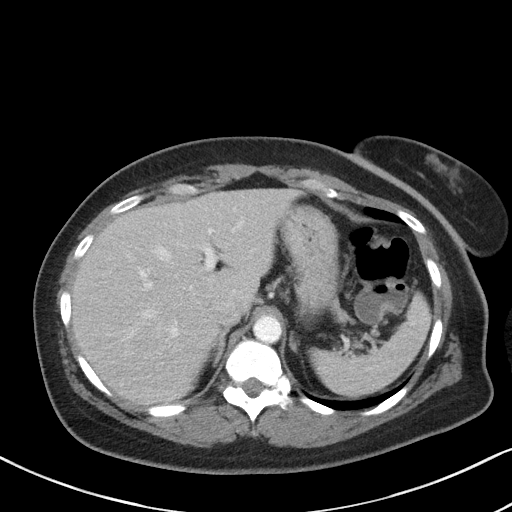
[im 84/89  soft-tissue]
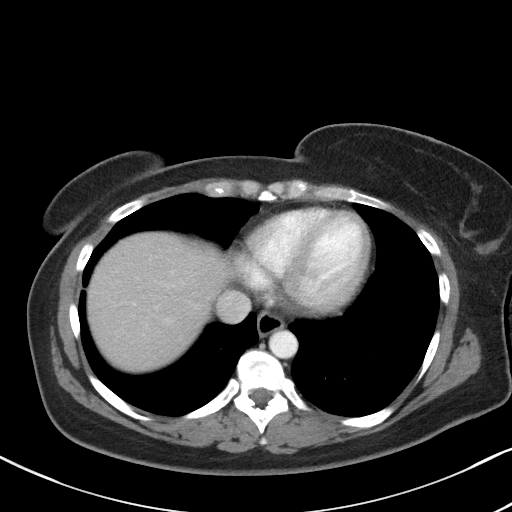

[Series 5: coronal st · coronal · 0.72mm/px · 3 of 77 slices shown]
[im 26/77  soft-tissue]
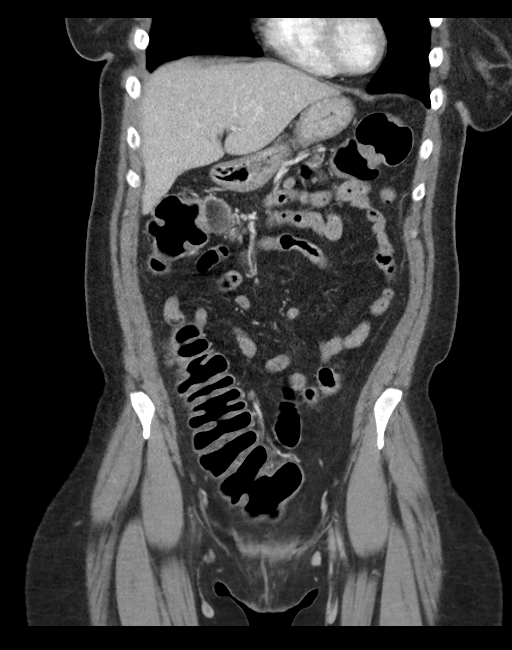
[im 34/77  soft-tissue]
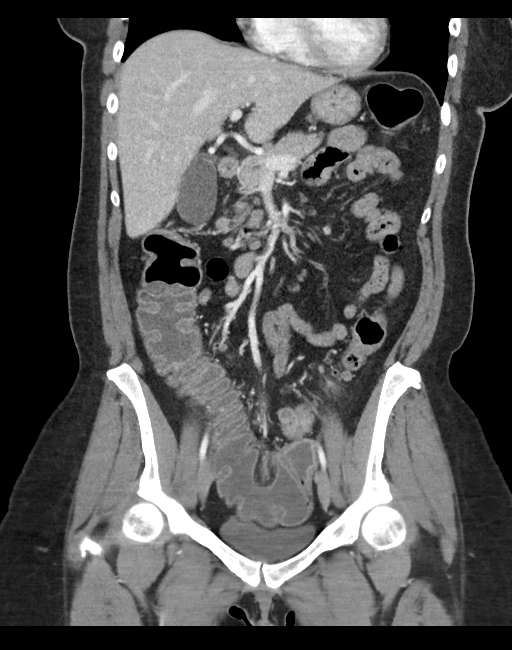
[im 43/77  soft-tissue]
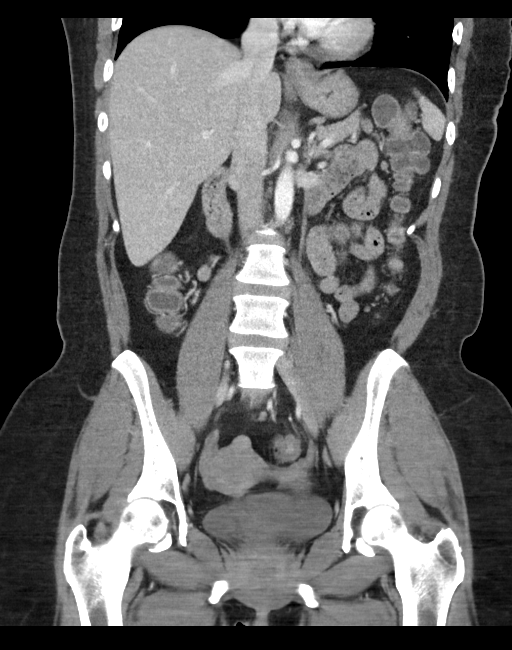

[16 of 46 positions shown; findings below may reference images not displayed]

FINDINGS: Lower chest: The lung bases are clear. The heart size is normal.

Hepatobiliary: The liver is normal. Normal gallbladder.There is no
biliary ductal dilation.

Pancreas: Normal contours without ductal dilatation. No
peripancreatic fluid collection.

Spleen: Unremarkable.

Adrenals/Urinary Tract:

--Adrenal glands: Unremarkable.

--Right kidney/ureter: No hydronephrosis or radiopaque kidney
stones.

--Left kidney/ureter: No hydronephrosis or radiopaque kidney stones.

--Urinary bladder: Unremarkable.

Stomach/Bowel:

--Stomach/Duodenum: No hiatal hernia or other gastric abnormality.
Normal duodenal course and caliber.

--Small bowel: Unremarkable.

--Colon: There is pancolonic wall thickening. Air-fluid levels are
noted in the colon.

--Appendix: Normal.

Vascular/Lymphatic: Atherosclerotic calcification is present within
the non-aneurysmal abdominal aorta, without hemodynamically
significant stenosis.

--there is mild retroperitoneal adenopathy.

--mesenteric adenopathy is noted.

--mild pelvic and perirectal lymphadenopathy is noted. There is
adenopathy a along the IMV and SMV.

Reproductive: There is a fibroid uterus.

Other: No ascites or free air. The abdominal wall is normal.

Musculoskeletal. No acute displaced fractures.
IMPRESSION: 1. Findings are consistent with pancolonic infectious or
inflammatory colitis.
2. Mesenteric adenopathy cyst similar 2 but progressed since prior
study in 5655. This is favored to be reactive in etiology. A 3-6
month follow-up CT would be useful to confirm resolution or
stability.
3. Fibroid uterus.

Aortic Atherosclerosis ([id number]).

## 2023-03-14 DIAGNOSIS — E1169 Type 2 diabetes mellitus with other specified complication: Secondary | ICD-10-CM | POA: Diagnosis not present

## 2023-03-14 DIAGNOSIS — Z0001 Encounter for general adult medical examination with abnormal findings: Secondary | ICD-10-CM | POA: Diagnosis not present

## 2023-03-14 DIAGNOSIS — Z6831 Body mass index (BMI) 31.0-31.9, adult: Secondary | ICD-10-CM | POA: Diagnosis not present

## 2023-03-14 DIAGNOSIS — E669 Obesity, unspecified: Secondary | ICD-10-CM | POA: Diagnosis not present

## 2023-03-14 DIAGNOSIS — E785 Hyperlipidemia, unspecified: Secondary | ICD-10-CM | POA: Diagnosis not present

## 2023-03-14 DIAGNOSIS — I10 Essential (primary) hypertension: Secondary | ICD-10-CM | POA: Diagnosis not present

## 2023-06-17 DIAGNOSIS — E559 Vitamin D deficiency, unspecified: Secondary | ICD-10-CM | POA: Diagnosis not present

## 2023-07-08 DIAGNOSIS — E559 Vitamin D deficiency, unspecified: Secondary | ICD-10-CM | POA: Diagnosis not present

## 2023-07-08 DIAGNOSIS — E785 Hyperlipidemia, unspecified: Secondary | ICD-10-CM | POA: Diagnosis not present

## 2023-07-08 DIAGNOSIS — I10 Essential (primary) hypertension: Secondary | ICD-10-CM | POA: Diagnosis not present

## 2023-12-11 ENCOUNTER — Emergency Department (HOSPITAL_COMMUNITY)

## 2023-12-11 ENCOUNTER — Other Ambulatory Visit: Payer: Self-pay

## 2023-12-11 ENCOUNTER — Emergency Department (HOSPITAL_COMMUNITY)
Admission: EM | Admit: 2023-12-11 | Discharge: 2023-12-11 | Disposition: A | Discharge status: Home / Self Care | Attending: Emergency Medicine | Admitting: Emergency Medicine

## 2023-12-11 ENCOUNTER — Encounter (HOSPITAL_COMMUNITY): Payer: Self-pay

## 2023-12-11 DIAGNOSIS — D72829 Elevated white blood cell count, unspecified: Secondary | ICD-10-CM | POA: Diagnosis not present

## 2023-12-11 DIAGNOSIS — R1084 Generalized abdominal pain: Secondary | ICD-10-CM | POA: Insufficient documentation

## 2023-12-11 DIAGNOSIS — R000 Tachycardia, unspecified: Secondary | ICD-10-CM | POA: Diagnosis not present

## 2023-12-11 DIAGNOSIS — R112 Nausea with vomiting, unspecified: Secondary | ICD-10-CM | POA: Diagnosis not present

## 2023-12-11 LAB — COMPREHENSIVE METABOLIC PANEL WITH GFR
ALT: 18 U/L (ref 0–44)
AST: 19 U/L (ref 15–41)
Albumin: 4.1 g/dL (ref 3.5–5.0)
Alkaline Phosphatase: 47 U/L (ref 38–126)
Anion gap: 12 (ref 5–15)
BUN: 10 mg/dL (ref 6–20)
CO2: 22 mmol/L (ref 22–32)
Calcium: 9.7 mg/dL (ref 8.9–10.3)
Chloride: 103 mmol/L (ref 98–111)
Creatinine, Ser: 0.99 mg/dL (ref 0.44–1.00)
GFR, Estimated: >60 mL/min (ref >60)
Glucose, Bld: 150 mg/dL — ABNORMAL HIGH (ref 70–99)
Potassium: 3.6 mmol/L (ref 3.5–5.1)
Sodium: 137 mmol/L (ref 135–145)
Total Bilirubin: 1.1 mg/dL (ref 0.0–1.2)
Total Protein: 8.5 g/dL — ABNORMAL HIGH (ref 6.5–8.1)

## 2023-12-11 LAB — CBC WITH DIFFERENTIAL/PLATELET
Abs Immature Granulocytes: 0.04 10*3/uL (ref 0.00–0.07)
Basophils Absolute: 0 10*3/uL (ref 0.0–0.1)
Basophils Relative: 0 %
Eosinophils Absolute: 0 10*3/uL (ref 0.0–0.5)
Eosinophils Relative: 0 %
HCT: 48.2 % — ABNORMAL HIGH (ref 36.0–46.0)
Hemoglobin: 15.2 g/dL — ABNORMAL HIGH (ref 12.0–15.0)
Immature Granulocytes: 0 %
Lymphocytes Relative: 9 %
Lymphs Abs: 1 10*3/uL (ref 0.7–4.0)
MCH: 26.3 pg (ref 26.0–34.0)
MCHC: 31.5 g/dL (ref 30.0–36.0)
MCV: 83.2 fL (ref 80.0–100.0)
Monocytes Absolute: 0.3 10*3/uL (ref 0.1–1.0)
Monocytes Relative: 2 %
Neutro Abs: 10.6 10*3/uL — ABNORMAL HIGH (ref 1.7–7.7)
Neutrophils Relative %: 89 %
Platelets: 348 10*3/uL (ref 150–400)
RBC: 5.79 MIL/uL — ABNORMAL HIGH (ref 3.87–5.11)
RDW: 14.5 % (ref 11.5–15.5)
WBC: 12 10*3/uL — ABNORMAL HIGH (ref 4.0–10.5)
nRBC: 0 % (ref 0.0–0.2)

## 2023-12-11 LAB — HCG, QUANTITATIVE, PREGNANCY: hCG, Beta Chain, Quant, S: 1 m[IU]/mL (ref <5)

## 2023-12-11 LAB — LIPASE, BLOOD: Lipase: 28 U/L (ref 11–51)

## 2023-12-11 MED ORDER — LACTATED RINGERS IV BOLUS
1000.0000 mL | Freq: Once | INTRAVENOUS | Status: AC
  Administered 2023-12-11: 1000 mL via INTRAVENOUS

## 2023-12-11 MED ORDER — ONDANSETRON 4 MG PO TBDP: 4.0000 mg | ORAL_TABLET | Freq: Three times a day (TID) | ORAL | 0 refills | Status: AC | PRN

## 2023-12-11 MED ORDER — IOHEXOL 300 MG/ML  SOLN
100.0000 mL | Freq: Once | INTRAMUSCULAR | Status: AC | PRN
  Administered 2023-12-11: 100 mL via INTRAVENOUS

## 2023-12-11 MED ORDER — ONDANSETRON HCL 4 MG/2ML IJ SOLN
4.0000 mg | Freq: Once | INTRAMUSCULAR | Status: AC
  Administered 2023-12-11: 4 mg via INTRAVENOUS
  Filled 2023-12-11: qty 2

## 2023-12-11 MED ORDER — KETOROLAC TROMETHAMINE 15 MG/ML IJ SOLN
15.0000 mg | Freq: Once | INTRAMUSCULAR | Status: AC
  Administered 2023-12-11: 15 mg via INTRAVENOUS
  Filled 2023-12-11: qty 1

## 2023-12-11 NOTE — Discharge Instructions (Signed)
 Thank you for letting us evaluate you today.  Your CT scan was unremarkable.  Your lab work was mostly unremarkable.  We provided you with pain medicine, antinausea medicine, fluids for symptoms.  I also sent Zofran to your pharmacy to use as needed for nausea.  Please make sure to maintain oral hydration with fluids, water, Gatorade, Pedialyte, chicken broth.  If you do not eat that is okay as long as you maintain your oral hydration.  Return to emergency department if you experience significant worsening of symptoms, intractable vomiting causing severe dehydration

## 2023-12-11 NOTE — ED Triage Notes (Signed)
 Pt states N/V since last night after eating [employer]. Generalized abdominal pain.

## 2023-12-11 NOTE — ED Provider Notes (Signed)
 [place] EMERGENCY DEPARTMENT AT [facility] Provider Note   CSN: [id number] Arrival date & time: 12/11/23  1523     History  Chief Complaint  Patient presents with   Emesis    Kara Cain is a 48 y.o. female with past medical history of sepsis secondary to pancolitis (2022) presents to emergency department for evaluation of generalized abdominal pain, vomiting that started 10 minutes following eating cookout at 1700 last night.  She endorses that she started vomiting at 12 PM last evening and has been throwing up every 10 minutes since.  Last BM was this morning and normal.  She denies diarrhea, blood in stool, fever, and complaints prior to eating Cook[employer] yesterday.  Friend at bedside also ate cookout but did not eat chicken nuggets that patient had.   Emesis Associated symptoms: abdominal pain   Associated symptoms: no chills, no cough, no diarrhea, no fever and no headaches        Home Medications Prior to Admission medications   Medication Sig Start Date End Date Taking? Authorizing Provider  amLODipine (NORVASC) 10 MG tablet Take 1 tablet (10 mg total) by mouth daily. 03/28/21   Serena Croissant, MD  Iron, Ferrous Sulfate, 325 (65 Fe) MG TABS Take 325 mg by mouth daily. 10/21/20   Standley Brooking, MD  ondansetron (ZOFRAN-ODT) 4 MG disintegrating tablet Take 1 tablet (4 mg total) by mouth every 8 (eight) hours as needed for nausea or vomiting. 12/11/23  Yes Judithann Sheen, PA      Allergies    Feraheme [ferumoxytol] and Hydrocodone    Review of Systems   Review of Systems  Constitutional:  Negative for chills, fatigue and fever.  Respiratory:  Negative for cough, chest tightness, shortness of breath and wheezing.   Cardiovascular:  Negative for chest pain and palpitations.  Gastrointestinal:  Positive for abdominal pain, nausea and vomiting. Negative for constipation and diarrhea.  Neurological:  Negative for dizziness, seizures, weakness,  light-headedness, numbness and headaches.    Physical Exam Updated Vital Signs BP (!) 163/87 (BP Location: Left Arm)   Pulse 94   Temp 98.4 F (36.9 C) (Oral)   Resp 18   Ht 5\' 4"  (1.626 m)   Wt 76.5 kg   SpO2 100%   BMI 28.95 kg/m  Physical Exam Vitals and nursing note reviewed.  Constitutional:      General: She is not in acute distress.    Appearance: Normal appearance.  HENT:     Head: Normocephalic and atraumatic.  Eyes:     Conjunctiva/sclera: Conjunctivae normal.  Cardiovascular:     Rate and Rhythm: Tachycardia present.     Comments: Mild tachycardia at 108bpm.  Following IVF, pulse decreases to 94bpm Pulmonary:     Effort: Pulmonary effort is normal. No respiratory distress.  Abdominal:     General: Bowel sounds are normal.     Tenderness: There is generalized abdominal tenderness. There is no right CVA tenderness, left CVA tenderness, guarding or rebound.  Musculoskeletal:     Right lower leg: No edema.     Left lower leg: No edema.  Skin:    General: Skin is warm.     Capillary Refill: Capillary refill takes less than 2 seconds.     Coloration: Skin is not jaundiced or pale.  Neurological:     Mental Status: She is alert and oriented to person, place, and time. Mental status is at baseline.     ED Results / Procedures /  Treatments   Labs (all labs ordered are listed, but only abnormal results are displayed) Labs Reviewed  CBC WITH DIFFERENTIAL/PLATELET - Abnormal; Notable for the following components:      Result Value   WBC 12.0 (*)    RBC 5.79 (*)    Hemoglobin 15.2 (*)    HCT 48.2 (*)    Neutro Abs 10.6 (*)    All other components within normal limits  COMPREHENSIVE METABOLIC PANEL WITH GFR - Abnormal; Notable for the following components:   Glucose, Bld 150 (*)    Total Protein 8.5 (*)    All other components within normal limits  LIPASE, BLOOD  HCG, QUANTITATIVE, PREGNANCY    EKG None  Radiology CT ABDOMEN PELVIS W CONTRAST Result  Date: 12/11/2023 CLINICAL DATA:  Abdominal pain with nausea and vomiting. EXAM: CT ABDOMEN AND PELVIS WITH CONTRAST TECHNIQUE: Multidetector CT imaging of the abdomen and pelvis was performed using the standard protocol following bolus administration of intravenous contrast. RADIATION DOSE REDUCTION: This exam was performed according to the departmental dose-optimization program which includes automated exposure control, adjustment of the mA and/or kV according to patient size and/or use of iterative reconstruction technique. CONTRAST:  OMNIPAQUE IOHEXOL 300 MG/ML  SOLN COMPARISON:  October 10, 2020 FINDINGS: Lower chest: No acute abnormality. Hepatobiliary: There is diffuse fatty infiltration of the liver parenchyma. No focal liver abnormality is seen. No gallstones, gallbladder wall thickening, or biliary dilatation. Pancreas: Unremarkable. No pancreatic ductal dilatation or surrounding inflammatory changes. Spleen: Normal in size without focal abnormality. Adrenals/Urinary Tract: Adrenal glands are unremarkable. Kidneys are normal, without renal calculi, focal lesion, or hydronephrosis. Bladder is unremarkable. Stomach/Bowel: Stomach is within normal limits. Appendix appears normal. No evidence of bowel dilatation. Moderate to markedly thickened and inflamed loops of jejunum are seen within the mid and upper abdomen. Noninflamed diverticula are seen throughout the sigmoid colon. Vascular/Lymphatic: Aortic atherosclerosis. No enlarged abdominal or pelvic lymph nodes. Reproductive: Uterus and bilateral adnexa are unremarkable. Other: No abdominal wall hernia or abnormality. A small amount of posterior pelvic free fluid is seen. Musculoskeletal: No acute or significant osseous findings. IMPRESSION: 1. Moderate to markedly severity infectious or inflammatory enteritis involving multiple loops of jejunum within the mid and upper abdomen. 2. Sigmoid diverticulosis. 3. Hepatic steatosis. 4. Aortic atherosclerosis.  Aortic Atherosclerosis (ICD10-I70.0). Electronically Signed   By: Aram Candela M.D.   On: 12/11/2023 20:03    Procedures Procedures    Medications Ordered in ED Medications  ondansetron (ZOFRAN) injection 4 mg (4 mg Intravenous Given 12/11/23 1700)  lactated ringers bolus 1,000 mL (0 mLs Intravenous Stopped 12/11/23 2016)  ketorolac (TORADOL) 15 MG/ML injection 15 mg (15 mg Intravenous Given 12/11/23 1700)  iohexol (OMNIPAQUE) 300 MG/ML solution 100 mL (100 mLs Intravenous Contrast Given 12/11/23 1835)    ED Course/ Medical Decision Making/ A&P                                 Medical Decision Making Amount and/or Complexity of Data Reviewed Labs: ordered. Radiology: ordered.  Risk Prescription drug management.   Patient presents to the ED for concern of abdominal pain, vomiting, this involves an extensive number of treatment options, and is a complaint that carries with it a high risk of complications and morbidity.  The differential diagnosis includes food poisoning, gastroenteritis, pregnancy, obstruction, prior, IBD, pancreatitis.  Definitely not exhaustive list   Co morbidities that complicate the patient evaluation  See HPI   Additional history obtained:  Additional history obtained from Nursing   External records from outside source obtained and reviewed including RN note   Lab Tests:  I Ordered, and personally interpreted labs.  The pertinent results include:   CBG 150 Mild leukocytosis of 12. Neutro abs 10.6 Hgb 15.2    Medicines ordered and prescription drug management:  I ordered medication including toradol and zofran  for abd pain and vomiting Reevaluation of the patient after these medicines showed that the patient improved I have reviewed the patients home medicines and have made adjustments as needed    Problem List / ED Course:  Generalized abdominal pain Nausea and vomiting ED workup notable for mild leukocytosis of 12 Provided  patient with Zofran and IVF in ED significantly improving symptoms.  No emesis while in ED.  Passed p.o. challenge CT notable for enteritis consistent with food poisoning or virus Will discharge patient with Zofran for nausea and discussed importance of oral hydration during this time Discussed return to emergency department cautions   Reevaluation:  After the interventions noted above, I reevaluated the patient and found that they have :improved    Dispostion:  After consideration of the diagnostic results and the patients response to treatment, I feel that the patent would benefit from outpatient management.   Discussed ED workup, disposition, return to ED precautions with patient who expresses understanding agrees with plan.  All questions answered to their satisfaction.  They are agreeable to plan.  Discharge instructions provided on paperwork  Final Clinical Impression(s) / ED Diagnoses Final diagnoses:  Generalized abdominal pain  Nausea and vomiting, unspecified vomiting type    Rx / DC Orders ED Discharge Orders          Ordered    ondansetron (ZOFRAN-ODT) 4 MG disintegrating tablet  Every 8 hours PRN        12/11/23 2013              Judithann Sheen, PA 12/12/23 1610    Jacalyn Lefevre, MD 12/15/23 [phone]
